# Patient Record
Sex: Male | Born: 1980 | Race: White | Hispanic: No | Marital: Single | Smoking: Current some day smoker
Health system: Southern US, Community
[De-identification: ages and names within clinical notes are randomized; demographics above are authoritative.]

---

## 2007-08-22 ENCOUNTER — Emergency Department (HOSPITAL_COMMUNITY): Admission: EM | Admit: 2007-08-22 | Discharge: 2007-08-22 | Payer: Self-pay | Admitting: Emergency Medicine

## 2009-03-22 IMAGING — CR DG KNEE COMPLETE 4+V*L*
4 series · 4 of 4 positions shown · non-contrast
Comparison: None.

CLINICAL DATA: Fell ? pain in anterolateral knee.  
 LEFT KNEE ? 4 VIEW:

[t knee ap left (1 of 2)]
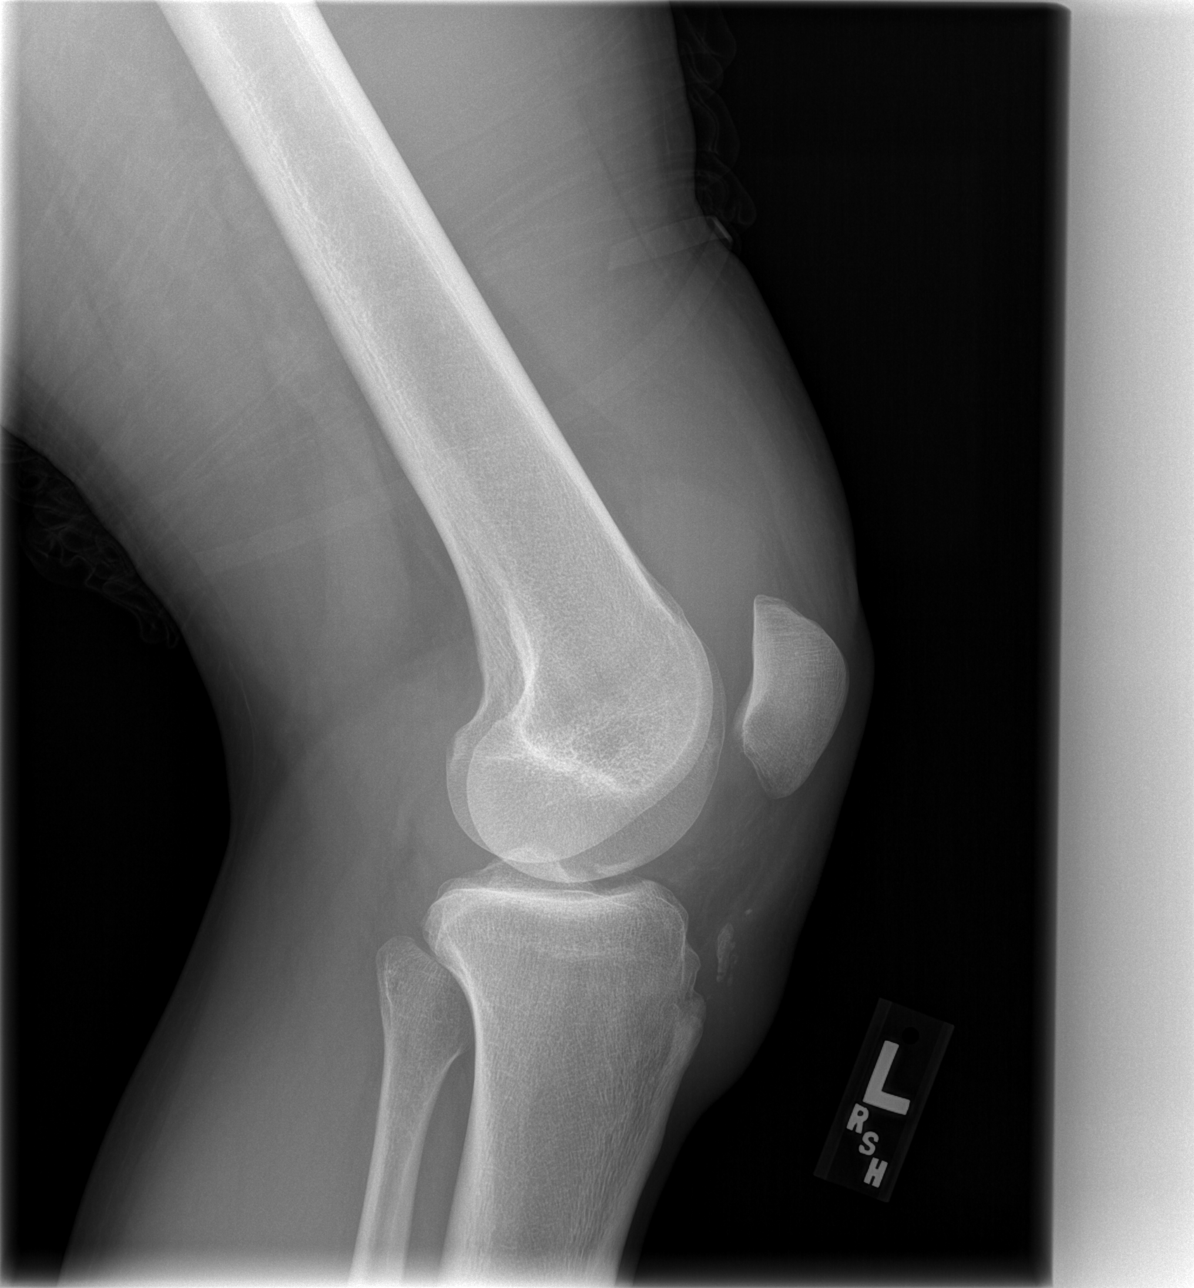

[t knee ap left (2 of 2)]
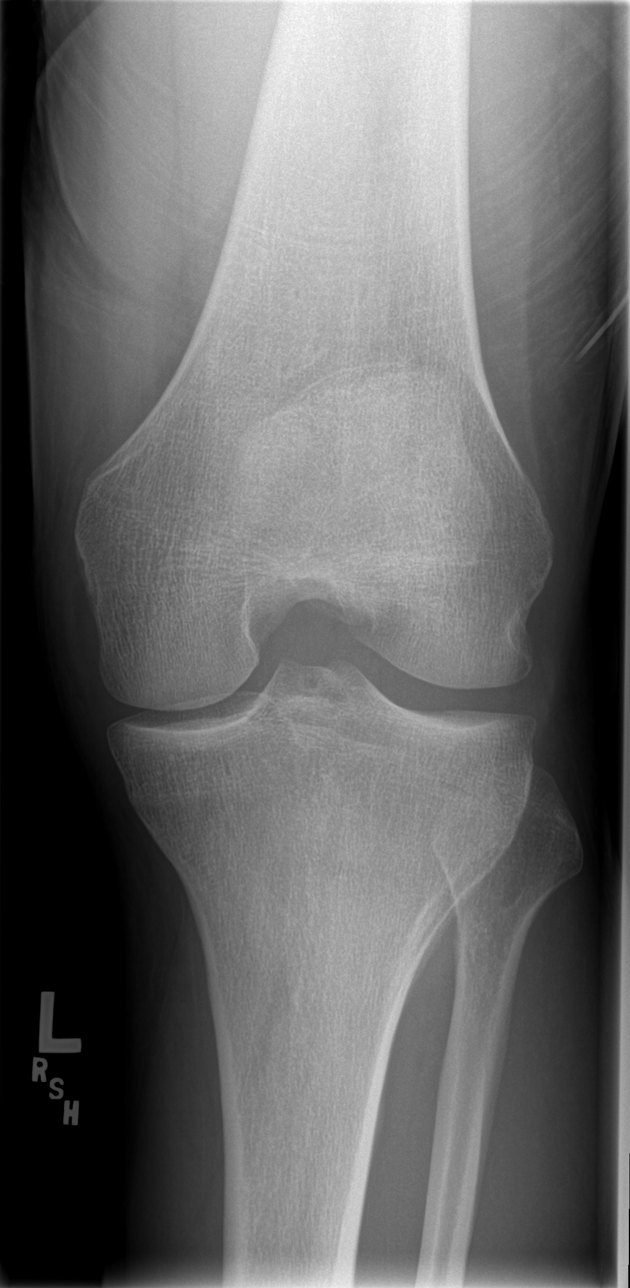

[t knee oblique left (1 of 2)]
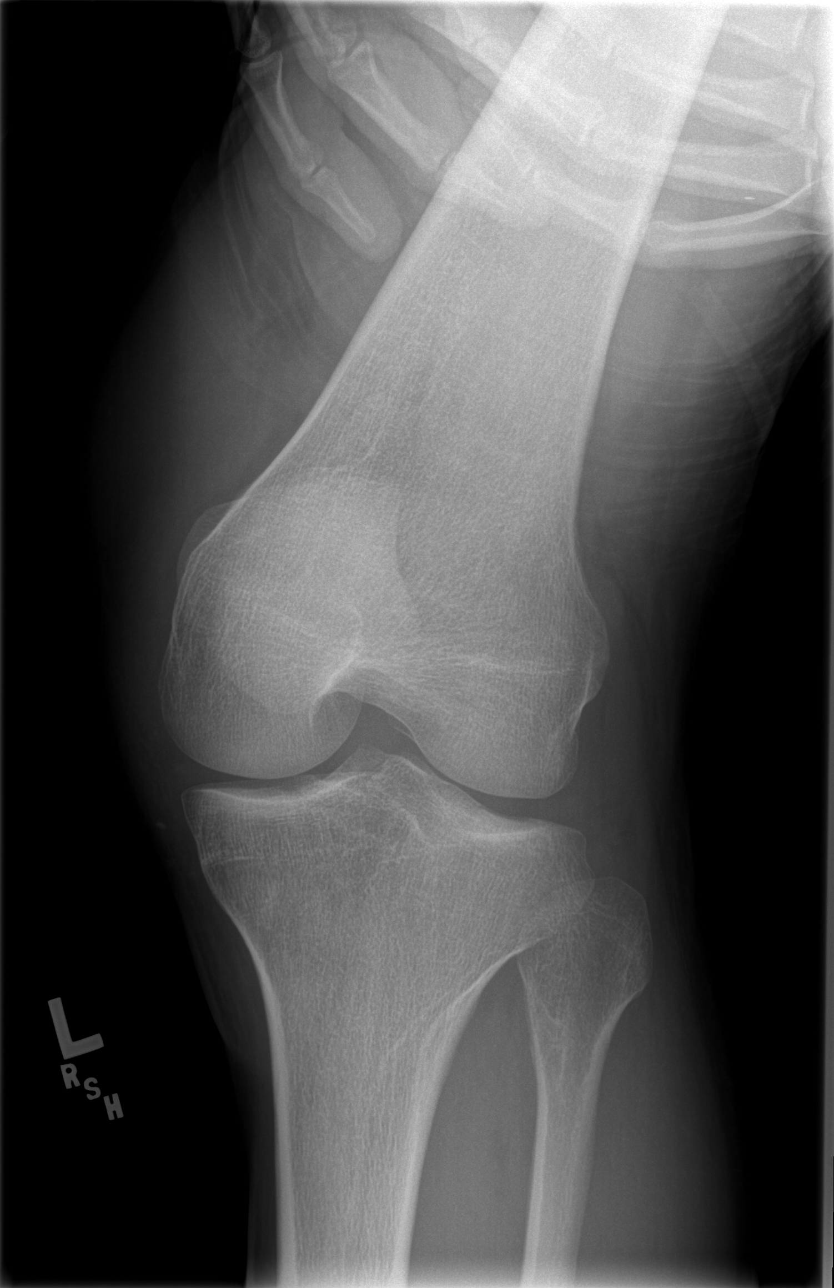

[t knee oblique left (2 of 2)]
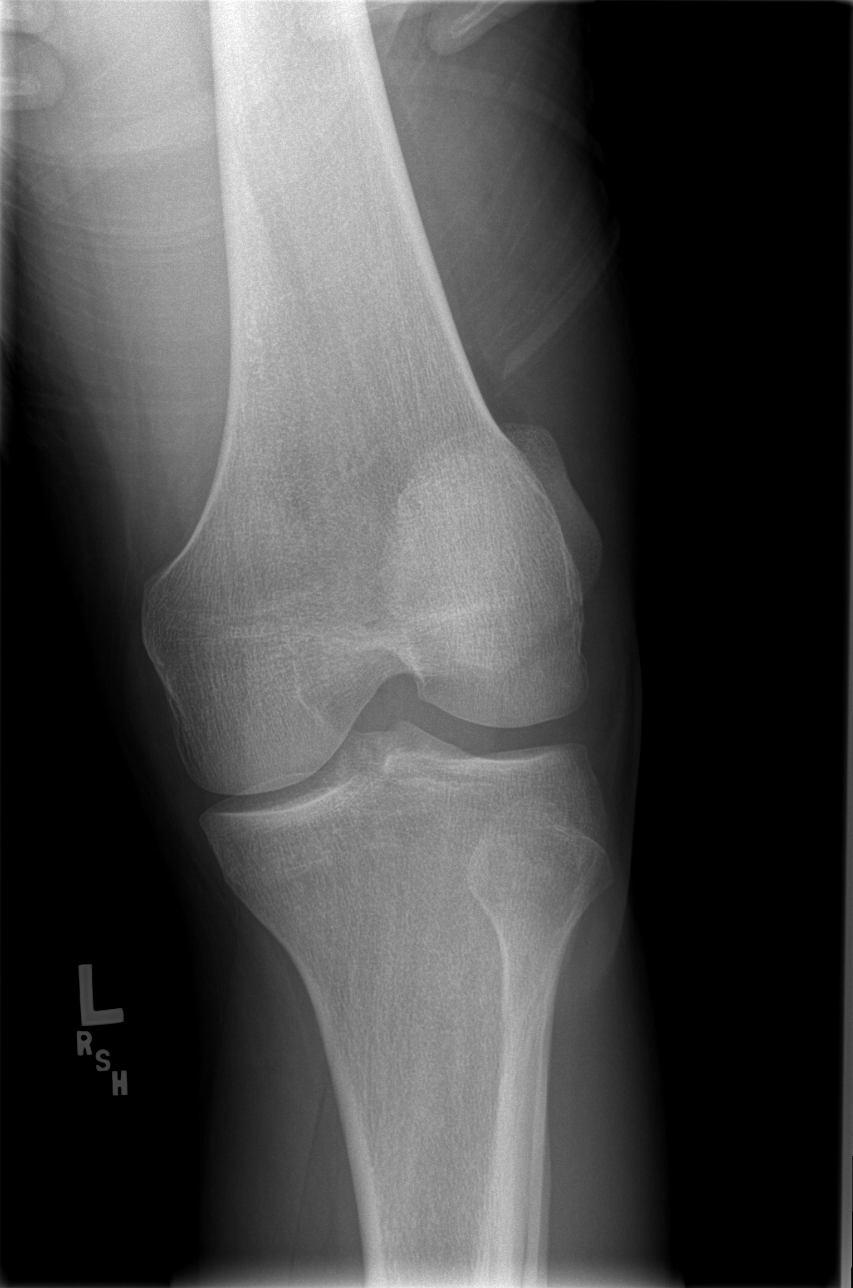

[4 of 4 positions shown; findings below may reference images not displayed]

FINDINGS: There is a joint effusion noted on the lateral view.  There is no definite fracture or dislocation.  There is some bony density anterior to the proximal tibia which may be calcification in the patellar/tibial tendon, or it may be related to fragmentation of the tibial tuberosity.  It is not acute.
IMPRESSION: 1.  There is a large joint effusion without underlying fracture or dislocation. 
 2.  See note above regarding calcification anterior to the proximal tibia.

## 2017-09-06 ENCOUNTER — Other Ambulatory Visit: Payer: Self-pay

## 2017-09-06 ENCOUNTER — Emergency Department (HOSPITAL_COMMUNITY)
Admission: EM | Admit: 2017-09-06 | Discharge: 2017-09-08 | Disposition: A | Discharge status: Other Acute Inpatient Hospital | Payer: Federal, State, Local not specified - Other | Attending: Emergency Medicine | Admitting: Emergency Medicine

## 2017-09-06 DIAGNOSIS — Z046 Encounter for general psychiatric examination, requested by authority: Secondary | ICD-10-CM | POA: Insufficient documentation

## 2017-09-06 DIAGNOSIS — R05 Cough: Secondary | ICD-10-CM | POA: Insufficient documentation

## 2017-09-06 DIAGNOSIS — R45850 Homicidal ideations: Secondary | ICD-10-CM | POA: Insufficient documentation

## 2017-09-06 DIAGNOSIS — R4689 Other symptoms and signs involving appearance and behavior: Secondary | ICD-10-CM | POA: Insufficient documentation

## 2017-09-06 DIAGNOSIS — R456 Violent behavior: Secondary | ICD-10-CM | POA: Insufficient documentation

## 2017-09-06 DIAGNOSIS — R45851 Suicidal ideations: Secondary | ICD-10-CM | POA: Insufficient documentation

## 2017-09-06 DIAGNOSIS — F1220 Cannabis dependence, uncomplicated: Secondary | ICD-10-CM

## 2017-09-06 DIAGNOSIS — R443 Hallucinations, unspecified: Secondary | ICD-10-CM | POA: Insufficient documentation

## 2017-09-06 DIAGNOSIS — F200 Paranoid schizophrenia: Secondary | ICD-10-CM | POA: Insufficient documentation

## 2017-09-06 DIAGNOSIS — F17200 Nicotine dependence, unspecified, uncomplicated: Secondary | ICD-10-CM | POA: Insufficient documentation

## 2017-09-06 DIAGNOSIS — F22 Delusional disorders: Secondary | ICD-10-CM

## 2017-09-06 LAB — SALICYLATE LEVEL

## 2017-09-06 LAB — COMPREHENSIVE METABOLIC PANEL
ALT: 14 U/L — AB (ref 17–63)
AST: 24 U/L (ref 15–41)
Albumin: 4.7 g/dL (ref 3.5–5.0)
Alkaline Phosphatase: 51 U/L (ref 38–126)
Anion gap: 9 (ref 5–15)
BILIRUBIN TOTAL: 0.6 mg/dL (ref 0.3–1.2)
BUN: 12 mg/dL (ref 6–20)
CALCIUM: 9.6 mg/dL (ref 8.9–10.3)
CHLORIDE: 105 mmol/L (ref 101–111)
CO2: 26 mmol/L (ref 22–32)
CREATININE: 1.21 mg/dL (ref 0.61–1.24)
Glucose, Bld: 122 mg/dL — ABNORMAL HIGH (ref 65–99)
Potassium: 4.6 mmol/L (ref 3.5–5.1)
Sodium: 140 mmol/L (ref 135–145)
TOTAL PROTEIN: 7.8 g/dL (ref 6.5–8.1)

## 2017-09-06 LAB — RAPID URINE DRUG SCREEN, HOSP PERFORMED
AMPHETAMINES: NOT DETECTED
Barbiturates: NOT DETECTED
Benzodiazepines: NOT DETECTED
Cocaine: NOT DETECTED
OPIATES: NOT DETECTED
TETRAHYDROCANNABINOL: POSITIVE — AB

## 2017-09-06 LAB — CBC WITH DIFFERENTIAL/PLATELET
BASOS PCT: 0 %
Basophils Absolute: 0 10*3/uL (ref 0.0–0.1)
EOS ABS: 0 10*3/uL (ref 0.0–0.7)
EOS PCT: 0 %
HCT: 42.1 % (ref 39.0–52.0)
HEMOGLOBIN: 14.1 g/dL (ref 13.0–17.0)
LYMPHS PCT: 13 %
Lymphs Abs: 1.4 10*3/uL (ref 0.7–4.0)
MCH: 30.4 pg (ref 26.0–34.0)
MCHC: 33.5 g/dL (ref 30.0–36.0)
MCV: 90.7 fL (ref 78.0–100.0)
MONO ABS: 0.5 10*3/uL (ref 0.1–1.0)
MONOS PCT: 5 %
NEUTROS ABS: 8.3 10*3/uL — AB (ref 1.7–7.7)
Neutrophils Relative %: 82 %
PLATELETS: 228 10*3/uL (ref 150–400)
RBC: 4.64 MIL/uL (ref 4.22–5.81)
RDW: 13.6 % (ref 11.5–15.5)
WBC: 10.3 10*3/uL (ref 4.0–10.5)

## 2017-09-06 LAB — ETHANOL

## 2017-09-06 LAB — ACETAMINOPHEN LEVEL

## 2017-09-06 MED ORDER — ZIPRASIDONE MESYLATE 20 MG IM SOLR
20.0000 mg | Freq: Once | INTRAMUSCULAR | Status: AC
  Administered 2017-09-06: 20 mg via INTRAMUSCULAR
  Filled 2017-09-06: qty 20

## 2017-09-06 MED ORDER — STERILE WATER FOR INJECTION IJ SOLN
INTRAMUSCULAR | Status: AC
  Administered 2017-09-06: 10 mL
  Filled 2017-09-06: qty 10

## 2017-09-06 MED ORDER — DIPHENHYDRAMINE HCL 50 MG/ML IJ SOLN
50.0000 mg | Freq: Once | INTRAMUSCULAR | Status: AC
  Administered 2017-09-06: 50 mg via INTRAMUSCULAR
  Filled 2017-09-06: qty 1

## 2017-09-06 MED ORDER — LORAZEPAM 2 MG/ML IJ SOLN
2.0000 mg | Freq: Once | INTRAMUSCULAR | Status: AC
  Administered 2017-09-06: 2 mg via INTRAMUSCULAR
  Filled 2017-09-06: qty 1
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
## 2017-09-06 NOTE — ED Notes (Signed)
Bed: [facility] Expected date:  Expected time:  Means of arrival:  Comments: 

## 2017-09-06 NOTE — ED Notes (Signed)
Patient drank urine sample. MD made aware.

## 2017-09-06 NOTE — ED Notes (Signed)
ED Provider at bedside. 
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
## 2017-09-06 NOTE — ED Notes (Signed)
Patient extremely paranoid and tearful on arrival to the unit. Standing in front of his door room refusing to go in. Patient stated that "people will atatck me when I lay down and I might be dead before I woke up. I want to stand here and fight for my self". Patient continues to say that he is scared of his life and for everybody in this world and doesn't know what's going to happen if he closed his eyes in a minute. Patient cries on/off. Refuses to go in his room on several attempt. Spencer NP, notified who gave one time order of Benadryl 50 mg, Ativan 2 mg and Geodon 20 mg IM. Patient very tearful when giving him the shot and he kept pleading that we save him by not giving him the shot. Initially was not cooperative with this Clinical research associate but complied when the security/[employer] got involved. Sitting in his bed at this time. Will continue to monitor patient.

## 2017-09-07 DIAGNOSIS — F1220 Cannabis dependence, uncomplicated: Secondary | ICD-10-CM

## 2017-09-07 DIAGNOSIS — F200 Paranoid schizophrenia: Secondary | ICD-10-CM | POA: Diagnosis present

## 2017-09-07 MED ORDER — GABAPENTIN 300 MG PO CAPS
300.0000 mg | ORAL_CAPSULE | Freq: Two times a day (BID) | ORAL | Status: DC
  Administered 2017-09-08: 300 mg via ORAL
  Filled 2017-09-07 (×2): qty 1

## 2017-09-07 MED ORDER — HYDROXYZINE HCL 25 MG PO TABS
25.0000 mg | ORAL_TABLET | Freq: Two times a day (BID) | ORAL | Status: DC
  Filled 2017-09-07: qty 1

## 2017-09-07 MED ORDER — HALOPERIDOL 5 MG PO TABS
5.0000 mg | ORAL_TABLET | Freq: Two times a day (BID) | ORAL | Status: DC
  Administered 2017-09-08: 5 mg via ORAL
  Filled 2017-09-07 (×2): qty 1

## 2017-09-07 MED ORDER — LORAZEPAM 2 MG/ML IJ SOLN: 2.0000 mg | Freq: Once | INTRAMUSCULAR | Status: DC | PRN

## 2017-09-07 MED ORDER — ZIPRASIDONE MESYLATE 20 MG IM SOLR: 20.0000 mg | Freq: Once | INTRAMUSCULAR | Status: DC | PRN

## 2017-09-07 NOTE — ED Notes (Signed)
EDP at bedside to assess pt 

## 2017-09-07 NOTE — BH Assessment (Signed)
[facility] Assessment Progress Note  Per Thedore Mins, MD, this pt requires psychiatric hospitalization at this time.  Pt presents under IVC initiated by law enforcement and upheld by Dr Jannifer Franklin.  The following facilities have been contacted to seek placement for this pt, with results as noted:  Beds available, information sent, decision pending:  [facility]   At capacity:  [facility] Doran Heater, [place] Triage Specialist [phone]

## 2017-09-07 NOTE — ED Notes (Signed)
Pt informed this nurse again that he drank his urine and needed to wash it down with pepper and salt. Pt received cup from dinner tray. Cup removed from room.  Pt counseled in regards to this behavior. This nurse notified Julieanne Cotton- Special checks q 15 mins in place for safety. Video surveillance in place. Will continue to monitor.
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
## 2017-09-07 NOTE — ED Notes (Signed)
Pt refusing PO medication regimen. Pt paranoid, bizarre behavior. Pt reports that medication is not sterile and must be boiled before taking. This nurse notified Dr.A. Pt also observed holding his groin, reports to this nurse that he has had a  Hernia for years. This nurse notified EDP. Reports he will be on unit to assess pt. Special checks q 15 mins in place for safety, Video monitoring in place. Will continue to monitor.

## 2017-09-07 NOTE — ED Notes (Signed)
This writer observed pt placing full carton of milk down pt's pants. This Clinical research associate asked pt if he was alright and if he needed help, pt stated "I need to get out of this place."

## 2017-09-07 NOTE — ED Notes (Signed)
Pt informed this nurse that he drank his urine from a cup because he can not drink the [place] water. This nurse notified Jacki Cones NP. No new orders given.

## 2017-09-07 NOTE — Progress Notes (Signed)
09/07/17 1352:  LRT went to pt room to offer activities, pt declined stating "people might get mad".  Pt also stated that he was the [employer] logo that pops up at the end of some movies.   Caroll Rancher, LRT/CTRS
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
## 2017-09-08 ENCOUNTER — Inpatient Hospital Stay (HOSPITAL_COMMUNITY)
Admission: AD | Admit: 2017-09-08 | Discharge: 2017-09-21 | DRG: 885 | Disposition: A | Discharge status: Home / Self Care | Payer: Federal, State, Local not specified - Other | Attending: Psychiatry | Admitting: Psychiatry

## 2017-09-08 DIAGNOSIS — X810XXA Intentional self-harm by jumping or lying in front of motor vehicle, initial encounter: Secondary | ICD-10-CM | POA: Diagnosis not present

## 2017-09-08 DIAGNOSIS — F1910 Other psychoactive substance abuse, uncomplicated: Secondary | ICD-10-CM | POA: Diagnosis not present

## 2017-09-08 DIAGNOSIS — K219 Gastro-esophageal reflux disease without esophagitis: Secondary | ICD-10-CM | POA: Diagnosis present

## 2017-09-08 DIAGNOSIS — F29 Unspecified psychosis not due to a substance or known physiological condition: Secondary | ICD-10-CM

## 2017-09-08 DIAGNOSIS — R443 Hallucinations, unspecified: Secondary | ICD-10-CM

## 2017-09-08 DIAGNOSIS — R4586 Emotional lability: Secondary | ICD-10-CM

## 2017-09-08 DIAGNOSIS — G4700 Insomnia, unspecified: Secondary | ICD-10-CM | POA: Diagnosis present

## 2017-09-08 DIAGNOSIS — F200 Paranoid schizophrenia: Secondary | ICD-10-CM | POA: Diagnosis present

## 2017-09-08 DIAGNOSIS — F17210 Nicotine dependence, cigarettes, uncomplicated: Secondary | ICD-10-CM | POA: Diagnosis not present

## 2017-09-08 DIAGNOSIS — F1290 Cannabis use, unspecified, uncomplicated: Secondary | ICD-10-CM | POA: Diagnosis present

## 2017-09-08 DIAGNOSIS — R45850 Homicidal ideations: Secondary | ICD-10-CM

## 2017-09-08 DIAGNOSIS — F259 Schizoaffective disorder, unspecified: Secondary | ICD-10-CM | POA: Diagnosis present

## 2017-09-08 DIAGNOSIS — F22 Delusional disorders: Secondary | ICD-10-CM

## 2017-09-08 DIAGNOSIS — K5900 Constipation, unspecified: Secondary | ICD-10-CM | POA: Diagnosis present

## 2017-09-08 DIAGNOSIS — F1220 Cannabis dependence, uncomplicated: Secondary | ICD-10-CM | POA: Diagnosis not present

## 2017-09-08 DIAGNOSIS — R454 Irritability and anger: Secondary | ICD-10-CM | POA: Diagnosis not present

## 2017-09-08 MED ORDER — BENZTROPINE MESYLATE 1 MG PO TABS
1.0000 mg | ORAL_TABLET | Freq: Two times a day (BID) | ORAL | Status: DC
  Administered 2017-09-08 (×2): 1 mg via ORAL
  Filled 2017-09-08 (×2): qty 1

## 2017-09-08 MED ORDER — LORAZEPAM 2 MG/ML IJ SOLN
2.0000 mg | Freq: Once | INTRAMUSCULAR | Status: AC
  Administered 2017-09-08: 2 mg via INTRAMUSCULAR
  Filled 2017-09-08: qty 1

## 2017-09-08 MED ORDER — HALOPERIDOL LACTATE 5 MG/ML IJ SOLN
5.0000 mg | Freq: Two times a day (BID) | INTRAMUSCULAR | Status: DC
Start: 2017-09-08 — End: 2017-09-08

## 2017-09-08 MED ORDER — BENZTROPINE MESYLATE 1 MG/ML IJ SOLN: 1.0000 mg | Freq: Two times a day (BID) | INTRAMUSCULAR | Status: DC

## 2017-09-08 MED ORDER — HALOPERIDOL LACTATE 5 MG/ML IJ SOLN
INTRAMUSCULAR | Status: AC
  Filled 2017-09-08: qty 1

## 2017-09-08 MED ORDER — DIPHENHYDRAMINE HCL 50 MG/ML IJ SOLN
50.0000 mg | Freq: Once | INTRAMUSCULAR | Status: AC
  Administered 2017-09-08: 50 mg via INTRAMUSCULAR
  Filled 2017-09-08: qty 1

## 2017-09-08 MED ORDER — HYDROXYZINE HCL 25 MG PO TABS
25.0000 mg | ORAL_TABLET | Freq: Two times a day (BID) | ORAL | Status: DC
  Administered 2017-09-08: 25 mg via ORAL
  Filled 2017-09-08: qty 1

## 2017-09-08 MED ORDER — HALOPERIDOL LACTATE 5 MG/ML IJ SOLN
10.0000 mg | Freq: Once | INTRAMUSCULAR | Status: AC
  Administered 2017-09-08: 10 mg via INTRAMUSCULAR
  Filled 2017-09-08: qty 2

## 2017-09-08 NOTE — Consult Note (Signed)
[facility] Consult   Reason for Consult:  Paranoid, psychotic and bizarre Referring Physician:  EDP Patient Identification: Stephen Harris MRN:  [id number] Principal Diagnosis: Paranoid schizophrenia [facility]) Diagnosis:   Patient Active Problem List   Diagnosis Date Noted  . Paranoid schizophrenia ([place]) [F20.0] 09/07/2017    Priority: High  . Cannabis use disorder, moderate, dependence ([place]) [F12.20] 09/07/2017    Total Time spent with patient: 45 minutes  Subjective:   Stephen Harris is a 36 y.o. male patient admitted with delusions and psychosis .  HPI:  Patient who was brought to [facility] by the police after he jumped out of a moving vehicle and said he was going to kill one of the Architect workers with the [place] who was replacing road side meters. He is extremely paranoid and psychotic. He has a fixed delusions that "the power company is out to get him" and says he should have "blow myself  up before getting arrested". Patient has refused to take his medications and inspect his food carefully due to fear of being poisoned. He is not sleeping, has racing thoughts, talk to himself constantly and hypervigilant. Patient has refused grooming and personal hygiene. He reports history of Cannabis use disorder.  Past Psychiatric History: as above  Risk to Self: Suicidal Ideation: No-Not Currently/Within Last 6 Months Suicidal Intent: No Is patient at risk for suicide?: No Suicidal Plan?: No Access to Means: No What has been your use of drugs/alcohol within the last 12 months?: THC How many times?:  (Unknown) Other Self Harm Risks: Unknown Triggers for Past Attempts: Unknown Intentional Self Injurious Behavior: None Risk to Others: Homicidal Ideation: Yes-Currently Present Thoughts of Harm to Others: Yes-Currently Present Comment - Thoughts of Harm to Others: Road workers Current Homicidal Intent: No Current Homicidal Plan: No Access to Homicidal Means:  No Identified Victim: Utility workers History of harm to others?: No (Unknown) Assessment of Violence: On admission Violent Behavior Description: Threats to harm others Does patient have access to weapons?: Yes (Comment) (Pt had a knife earlier today, threatening others with it.) Criminal Charges Pending?: No (Had a knife.) Does patient have a court date: No Prior Inpatient Therapy: Prior Inpatient Therapy: Yes Prior Therapy Dates: Unknown Prior Therapy Facilty/Provider(s): Unknown Reason for Treatment: Unknown Prior Outpatient Therapy: Prior Outpatient Therapy: No Prior Therapy Dates: Unknown Prior Therapy Facilty/Provider(s): Unknown Reason for Treatment: Unknown Does patient have an ACCT team?: No Does patient have Intensive In-House Services?  : No Does patient have [facility] services? : No Does patient have P4CC services?: No  Past Medical History: No past medical history on file. No past surgical history on file. Family History: No family history on file. Family Psychiatric  History:  Social History:  History  Alcohol use Not on file     History  Drug use: Unknown    Social History   Social History  . Marital status: Single    Spouse name: N/A  . Number of children: N/A  . Years of education: N/A   Social History Main Topics  . Smoking status: Not on file  . Smokeless tobacco: Not on file  . Alcohol use Not on file  . Drug use: Unknown  . Sexual activity: Not on file   Other Topics Concern  . Not on file   Social History Narrative  . No narrative on file   Additional Social History:    Allergies:   Allergies  Allergen Reactions  . Penicillins Hives    Has patient  had a PCN reaction causing immediate rash, facial/tongue/throat swelling, SOB or lightheadedness with hypotension: Yes Has patient had a PCN reaction causing severe rash involving mucus membranes or skin necrosis: No Has patient had a PCN reaction that required hospitalization: Unknown Has  patient had a PCN reaction occurring within the last 10 years: No If all of the above answers are "NO", then may proceed with Cephalosporin use.     Labs:  Results for orders placed or performed during the hospital encounter of 09/06/17 (from the past 48 hour(s))  Urine rapid drug screen (hosp performed)     Status: Abnormal   Collection Time: 09/06/17  6:07 PM  Result Value Ref Range   Opiates NONE DETECTED NONE DETECTED   Cocaine NONE DETECTED NONE DETECTED   Benzodiazepines NONE DETECTED NONE DETECTED   Amphetamines NONE DETECTED NONE DETECTED   Tetrahydrocannabinol POSITIVE (A) NONE DETECTED   Barbiturates NONE DETECTED NONE DETECTED    Comment:        DRUG SCREEN FOR MEDICAL PURPOSES ONLY.  IF CONFIRMATION IS NEEDED FOR ANY PURPOSE, NOTIFY LAB WITHIN 5 DAYS.        LOWEST DETECTABLE LIMITS FOR URINE DRUG SCREEN Drug Class       Cutoff (ng/mL) Amphetamine      1000 Barbiturate      200 Benzodiazepine   914 Tricyclics       782 Opiates          300 Cocaine          300 THC              50   Comprehensive metabolic panel     Status: Abnormal   Collection Time: 09/06/17  6:21 PM  Result Value Ref Range   Sodium 140 135 - 145 mmol/L   Potassium 4.6 3.5 - 5.1 mmol/L   Chloride 105 101 - 111 mmol/L   CO2 26 22 - 32 mmol/L   Glucose, Bld 122 (H) 65 - 99 mg/dL   BUN 12 6 - 20 mg/dL   Creatinine, Ser 1.21 0.61 - 1.24 mg/dL   Calcium 9.6 8.9 - 10.3 mg/dL   Total Protein 7.8 6.5 - 8.1 g/dL   Albumin 4.7 3.5 - 5.0 g/dL   AST 24 15 - 41 U/L   ALT 14 (L) 17 - 63 U/L   Alkaline Phosphatase 51 38 - 126 U/L   Total Bilirubin 0.6 0.3 - 1.2 mg/dL   GFR calc non Af Amer >60 >60 mL/min   GFR calc Af Amer >60 >60 mL/min    Comment: (NOTE) The eGFR has been calculated using the CKD EPI equation. This calculation has not been validated in all clinical situations. eGFR's persistently <60 mL/min signify possible Chronic Kidney Disease.    Anion gap 9 5 - 15  Ethanol     Status:  None   Collection Time: 09/06/17  6:21 PM  Result Value Ref Range   Alcohol, Ethyl (B) <10 <10 mg/dL    Comment:        LOWEST DETECTABLE LIMIT FOR SERUM ALCOHOL IS 10 mg/dL FOR MEDICAL PURPOSES ONLY Please note change in reference range.   CBC with Diff     Status: Abnormal   Collection Time: 09/06/17  6:21 PM  Result Value Ref Range   WBC 10.3 4.0 - 10.5 K/uL   RBC 4.64 4.22 - 5.81 MIL/uL   Hemoglobin 14.1 13.0 - 17.0 g/dL   HCT 42.1 39.0 - 52.0 %  MCV 90.7 78.0 - 100.0 fL   MCH 30.4 26.0 - 34.0 pg   MCHC 33.5 30.0 - 36.0 g/dL   RDW 13.6 11.5 - 15.5 %   Platelets 228 150 - 400 K/uL   Neutrophils Relative % 82 %   Neutro Abs 8.3 (H) 1.7 - 7.7 K/uL   Lymphocytes Relative 13 %   Lymphs Abs 1.4 0.7 - 4.0 K/uL   Monocytes Relative 5 %   Monocytes Absolute 0.5 0.1 - 1.0 K/uL   Eosinophils Relative 0 %   Eosinophils Absolute 0.0 0.0 - 0.7 K/uL   Basophils Relative 0 %   Basophils Absolute 0.0 0.0 - 0.1 K/uL  Salicylate level     Status: None   Collection Time: 09/06/17  6:21 PM  Result Value Ref Range   Salicylate Lvl <6.6 2.8 - 30.0 mg/dL  Acetaminophen level     Status: Abnormal   Collection Time: 09/06/17  6:21 PM  Result Value Ref Range   Acetaminophen (Tylenol), Serum <10 (L) 10 - 30 ug/mL    Comment:        THERAPEUTIC CONCENTRATIONS VARY SIGNIFICANTLY. A RANGE OF 10-30 ug/mL MAY BE AN EFFECTIVE CONCENTRATION FOR MANY PATIENTS. HOWEVER, SOME ARE BEST TREATED AT CONCENTRATIONS OUTSIDE THIS RANGE. ACETAMINOPHEN CONCENTRATIONS >150 ug/mL AT 4 HOURS AFTER INGESTION AND >50 ug/mL AT 12 HOURS AFTER INGESTION ARE OFTEN ASSOCIATED WITH TOXIC REACTIONS.     Current Facility-Administered Medications  Medication Dose Route Frequency Provider Last Rate Last Dose  . haloperidol lactate (HALDOL) 5 MG/ML injection           . benztropine (COGENTIN) tablet 1 mg  1 mg Oral BID Lizvet Chunn, MD       Or  . benztropine mesylate (COGENTIN) injection 1 mg  1 mg  Intramuscular BID Brooklen Runquist, MD      . diphenhydrAMINE (BENADRYL) injection 50 mg  50 mg Intramuscular Once Calla Wedekind, MD      . gabapentin (NEURONTIN) capsule 300 mg  300 mg Oral BID Frankey Botting, MD      . haloperidol (HALDOL) tablet 5 mg  5 mg Oral BID Dondrea Clendenin, MD      . haloperidol lactate (HALDOL) injection 10 mg  10 mg Intramuscular Once Charlissa Petros, MD      . haloperidol lactate (HALDOL) injection 5 mg  5 mg Intramuscular BID Conny Moening, MD      . hydrOXYzine (ATARAX/VISTARIL) tablet 25 mg  25 mg Oral BID Isha Seefeld, MD      . LORazepam (ATIVAN) injection 2 mg  2 mg Intramuscular Once Corena Pilgrim, MD       Current Outpatient Prescriptions  Medication Sig Dispense Refill  . aspirin 325 MG tablet Take 325 mg by mouth every 4 (four) hours as needed for mild pain.      Musculoskeletal: Strength & Muscle Tone: within normal limits Gait & Station: normal Patient leans: N/A  Psychiatric Specialty Exam: Physical Exam  Psychiatric: His affect is labile. His speech is rapid and/or pressured and tangential. He is aggressive, actively hallucinating and combative. Thought content is paranoid and delusional. Cognition and memory are normal. He expresses impulsivity. He expresses homicidal ideation. He expresses homicidal plans.    Review of Systems  Constitutional: Negative.   HENT: Negative.   Eyes: Negative.   Respiratory: Negative.   Cardiovascular: Negative.   Gastrointestinal: Negative.   Genitourinary: Negative.   Musculoskeletal: Negative.   Skin: Negative.   Neurological: Negative.   Endo/Heme/Allergies: Negative.  Psychiatric/Behavioral: Positive for hallucinations and substance abuse. The patient is nervous/anxious and has insomnia.     Blood pressure 126/73, pulse 69, temperature 99 F (37.2 C), temperature source Oral, resp. rate 16, height 6' 2"  (1.88 m), weight 82.1 kg (181 lb), SpO2 99 %.Body mass index is 23.24 kg/m.   General Appearance: Disheveled  Eye Contact:  Good  Speech:  Pressured  Volume:  Increased  Mood:  Irritable  Affect:  Labile  Thought Process:  Disorganized  Orientation:  Full (Time, Place, and Person)  Thought Content:  Illogical, Hallucinations: Auditory and Paranoid Ideation  Suicidal Thoughts:  No  Homicidal Thoughts:  Yes.  without intent/plan  Memory:  Immediate;   Fair Recent;   Fair Remote;   Fair  Judgement:  Poor  Insight:  Lacking  Psychomotor Activity:  Increased and Restlessness  Concentration:  Concentration: Fair and Attention Span: Fair  Recall:  [employer] of Knowledge:  Fair  Language:  Good  Akathisia:  No  Handed:  Right  AIMS (if indicated):     Assets:  Communication Skills  ADL's:  Intact  Cognition:  WNL  Sleep:   poor     Treatment Plan Summary: Daily contact with patient to assess and evaluate symptoms and progress in treatment and Medication management Patient was evaluated by Jola Schmidt, MD for second opinion to administer forced medication due to patient's level of acuity. Encourage patient to take oral medication. Haldol 5 mg PO or IM bid for psychosis/delusions Cogentin 1 mg bid PO or IM bid for EPS prevention Gabapentin 300 mg bid for aggression  Disposition: Recommend psychiatric Inpatient admission when medically cleared.  Corena Pilgrim, MD 09/08/2017 10:56 AM

## 2017-09-08 NOTE — ED Notes (Signed)
Up to the bathroom 

## 2017-09-08 NOTE — Progress Notes (Signed)
09/08/17 1412:  LRT introduced self to pt and offered activities.  Pt was standing in his doorway, talking about his tomato plants.  Pt was unable to focus on questions from LRT.  Pt was just rambling about various topics.   Caroll Rancher, LRT/CTRS

## 2017-09-08 NOTE — ED Notes (Signed)
Patient took a shower and cleaned up after himself. Patient was thankful to staff for helping him. Encouragement and support provided and safety maintain. Q 15 min safety checks remain in place and video monitoring.

## 2017-09-08 NOTE — ED Notes (Addendum)
Patient denies SI/HI/AVH. Plan care discussed. Encouragement and support provided and safety maintain. Q 15 min safety checks remain in place and video monitoring.

## 2017-09-08 NOTE — ED Notes (Addendum)
EDP into see.  Pt up in door, requesting a cup of "boiled sterilized water turned into ice.Marland KitchenMarland Kitchen"

## 2017-09-08 NOTE — ED Provider Notes (Signed)
The patient in unable to make decisions for himself. I think he will benefit from psychiatric medications for stabilization.  He does not have the ability to refuse these mentally and if necessary I think forced meds will be appropriate   Azalia Bilis, MD 09/08/17 [phone]

## 2017-09-08 NOTE — ED Notes (Signed)
Pt escorted to the bathroom to void.

## 2017-09-08 NOTE — ED Notes (Signed)
Laying on the bed, calmer

## 2017-09-08 NOTE — ED Notes (Signed)
VS defered, pt sleeping

## 2017-09-08 NOTE — ED Notes (Signed)
[facility] contacted for patient transport to [facility]. 

## 2017-09-08 NOTE — ED Notes (Signed)
Patient took medication without hesitation.

## 2017-09-08 NOTE — ED Notes (Signed)
mHt reports that pt told her that he has top drink his urine because it is ADS infected and can not touch water or anything else.  Pt has been refusing to use the bathroom

## 2017-09-08 NOTE — ED Notes (Signed)
Up in room appears to be praying over his breakfast

## 2017-09-08 NOTE — ED Notes (Signed)
Attempted to call report nurse unable to take report at this time.

## 2017-09-08 NOTE — ED Notes (Addendum)
Pt medicated w/o difficulty.  Remained up in room, concerned about his getting his paperwork, and the talking about the dinosaurs support given.

## 2017-09-08 NOTE — ED Notes (Signed)
Per mHt pt declined to allow temp to be taken

## 2017-09-08 NOTE — ED Notes (Signed)
Patient transported to [facility], by [facility], for continuation of specialized care. Belongings given to deputy. Pt left in no acute distress.

## 2017-09-08 NOTE — ED Notes (Signed)
standing in door yelling about "all over my body" support given

## 2017-09-08 NOTE — BH Assessment (Signed)
[facility] Assessment Progress Note  er Thedore Mins, MD, this pt requires psychiatric hospitalization.  Malva Limes, RN, [facility] has assigned pt to [facility] Rm [id number]; they will call when they are ready to receive pt.  Pt presents under IVC initiated by law enforcement, and upheld by Thedore Mins, MD, and IVC documents have been faxed to [facility].  Pt's nurse, Wille Celeste, has been notified, and agrees to call report to [phone].  Pt is to be transported via Patent examiner.   Doylene Canning, MA Triage Specialist [phone]

## 2017-09-09 ENCOUNTER — Other Ambulatory Visit: Payer: Self-pay

## 2017-09-09 ENCOUNTER — Encounter (HOSPITAL_COMMUNITY): Payer: Self-pay

## 2017-09-09 DIAGNOSIS — F419 Anxiety disorder, unspecified: Secondary | ICD-10-CM

## 2017-09-09 DIAGNOSIS — F17210 Nicotine dependence, cigarettes, uncomplicated: Secondary | ICD-10-CM

## 2017-09-09 DIAGNOSIS — R4587 Impulsiveness: Secondary | ICD-10-CM

## 2017-09-09 DIAGNOSIS — R450 Nervousness: Secondary | ICD-10-CM

## 2017-09-09 DIAGNOSIS — R4582 Worries: Secondary | ICD-10-CM

## 2017-09-09 DIAGNOSIS — R45850 Homicidal ideations: Secondary | ICD-10-CM

## 2017-09-09 DIAGNOSIS — F1910 Other psychoactive substance abuse, uncomplicated: Secondary | ICD-10-CM

## 2017-09-09 DIAGNOSIS — X810XXA Intentional self-harm by jumping or lying in front of motor vehicle, initial encounter: Secondary | ICD-10-CM

## 2017-09-09 DIAGNOSIS — F200 Paranoid schizophrenia: Secondary | ICD-10-CM | POA: Diagnosis present

## 2017-09-09 DIAGNOSIS — F1220 Cannabis dependence, uncomplicated: Secondary | ICD-10-CM

## 2017-09-09 DIAGNOSIS — R440 Auditory hallucinations: Secondary | ICD-10-CM

## 2017-09-09 DIAGNOSIS — F39 Unspecified mood [affective] disorder: Secondary | ICD-10-CM

## 2017-09-09 DIAGNOSIS — R4586 Emotional lability: Secondary | ICD-10-CM

## 2017-09-09 MED ORDER — DIPHENHYDRAMINE HCL 50 MG/ML IJ SOLN: 50.0000 mg | Freq: Once | INTRAMUSCULAR | Status: DC | PRN

## 2017-09-09 MED ORDER — HALOPERIDOL 5 MG PO TABS
5.0000 mg | ORAL_TABLET | Freq: Two times a day (BID) | ORAL | Status: DC
  Administered 2017-09-09: 2.5 mg via ORAL
  Administered 2017-09-09 – 2017-09-11 (×4): 5 mg via ORAL
  Filled 2017-09-09 (×7): qty 1

## 2017-09-09 MED ORDER — MAGNESIUM HYDROXIDE 400 MG/5ML PO SUSP: 30.0000 mL | Freq: Every day | ORAL | Status: DC | PRN

## 2017-09-09 MED ORDER — HALOPERIDOL LACTATE 5 MG/ML IJ SOLN
5.0000 mg | Freq: Two times a day (BID) | INTRAMUSCULAR | Status: DC
  Filled 2017-09-09 (×7): qty 1

## 2017-09-09 MED ORDER — HALOPERIDOL LACTATE 5 MG/ML IJ SOLN: 10.0000 mg | Freq: Once | INTRAMUSCULAR | Status: DC | PRN

## 2017-09-09 MED ORDER — ACETAMINOPHEN 325 MG PO TABS: 650.0000 mg | ORAL_TABLET | Freq: Four times a day (QID) | ORAL | Status: DC | PRN

## 2017-09-09 MED ORDER — HYDROXYZINE HCL 25 MG PO TABS
25.0000 mg | ORAL_TABLET | Freq: Two times a day (BID) | ORAL | Status: DC
  Administered 2017-09-09 – 2017-09-21 (×23): 25 mg via ORAL
  Filled 2017-09-09 (×27): qty 1

## 2017-09-09 MED ORDER — LORAZEPAM 2 MG/ML IJ SOLN: 2.0000 mg | Freq: Once | INTRAMUSCULAR | Status: DC | PRN

## 2017-09-09 MED ORDER — GABAPENTIN 300 MG PO CAPS
300.0000 mg | ORAL_CAPSULE | Freq: Two times a day (BID) | ORAL | Status: DC
  Administered 2017-09-09 – 2017-09-18 (×19): 300 mg via ORAL
  Filled 2017-09-09 (×25): qty 1

## 2017-09-09 MED ORDER — BENZTROPINE MESYLATE 1 MG PO TABS
1.0000 mg | ORAL_TABLET | Freq: Two times a day (BID) | ORAL | Status: DC
  Administered 2017-09-09 – 2017-09-21 (×25): 1 mg via ORAL
  Filled 2017-09-09 (×27): qty 1

## 2017-09-09 MED ORDER — ALUM & MAG HYDROXIDE-SIMETH 200-200-20 MG/5ML PO SUSP: 30.0000 mL | ORAL | Status: DC | PRN

## 2017-09-09 MED ORDER — BENZTROPINE MESYLATE 1 MG/ML IJ SOLN
1.0000 mg | Freq: Two times a day (BID) | INTRAMUSCULAR | Status: DC
  Filled 2017-09-09 (×7): qty 1

## 2017-09-09 MED ORDER — TRAZODONE HCL 50 MG PO TABS
50.0000 mg | ORAL_TABLET | Freq: Every day | ORAL | Status: DC
  Filled 2017-09-09 (×14): qty 1

## 2017-09-09 MED ORDER — ENSURE ENLIVE PO LIQD
237.0000 mL | Freq: Two times a day (BID) | ORAL | Status: DC
  Administered 2017-09-10 – 2017-09-21 (×10): 237 mL via ORAL

## 2017-09-09 NOTE — [facility] Group Notes (Signed)
LCSW Group Therapy 09/09/2017 1:15pm  Type of Therapy and Topic:  Group Therapy:  Change and Accountability  Participation Level:  Did Not Attend  Description of Group In this group, patients discussed power and accountability for change.  The group identified the challenges related to accountability and the difficulty of accepting the outcomes of negative behaviors.  Patients were encouraged to openly discuss a challenge/change they could take responsibility for.  Patients discussed the use of "change talk" and positive thinking as ways to support achievement of personal goals.  The group discussed ways to give support and empowerment to peers.  Therapeutic Goals: 1. Patients will state the relationship between personal power and accountability in the change process 2. Patients will identify the positive and negative consequences of a personal choice they have made 3. Patients will identify one challenge/choice they will take responsibility for making 4. Patients will discuss the role of "change talk" and the impact of positive thinking as it supports successful personal change 5. Patients will verbalize support and affirmation of change efforts in peers  Summary of Patient Progress:    Therapeutic Modalities Solution Focused Brief Therapy Motivational Interviewing Cognitive Behavioral Therapy  Carlynn Herald Work 09/09/2017 2:46 PM

## 2017-09-09 NOTE — Progress Notes (Signed)
D: Pt denies SI/HI/AVH. Pt is pleasant and cooperative. Pt presents disorganized and paranoid at times, pt forwards little information at this time. Pt was in the dayroom watching TV earlier but kept to himself mostly.   A: Pt was offered support and encouragement. Pt was given scheduled medications. Pt was encourage to attend groups. Q 15 minute checks were done for safety.   R:Pt attends groups and interacts well with peers and staff. Pt is taking medication. Pt has no complaints.Pt receptive to treatment and safety maintained on unit.

## 2017-09-09 NOTE — Progress Notes (Signed)
NUTRITION ASSESSMENT  Pt identified as at risk on the Malnutrition Screen Tool  INTERVENTION: 1. Supplements: Ensure Enlive po BID, each supplement provides 350 kcal and 20 grams of protein  NUTRITION DIAGNOSIS: Unintentional weight loss related to sub-optimal intake as evidenced by pt report.   Goal: Pt to meet >/= 90% of their estimated nutrition needs.  Monitor:  PO intake  Assessment:  Pt admitted with schizophrenia. Pt not eating well d/t fear that food is being poisoned. Per chart review, pt has lost 23 lb but uncertain of time frame, as it is documented pt weighed 181 lb on 10/8. Will order Ensure supplements given poor PO intakes.   Height: Ht Readings from Last 1 Encounters:  09/09/17 5' 8.9" (1.75 m)    Weight: Wt Readings from Last 1 Encounters:  09/09/17 158 lb (71.7 kg)    Weight Hx: Wt Readings from Last 10 Encounters:  09/09/17 158 lb (71.7 kg)  09/06/17 181 lb (82.1 kg)    BMI:  Body mass index is 23.4 kg/m. Pt meets criteria for normal based on current BMI.  Estimated Nutritional Needs: Kcal: 25-30 kcal/kg Protein: > 1 gram protein/kg Fluid: 1 ml/kcal  Diet Order: Diet regular Room service appropriate? No; Fluid consistency: Thin Pt is also offered choice of unit snacks mid-morning and mid-afternoon.  Pt is eating as desired.   Lab results and medications reviewed.   Tilda Franco, MS, RD, LDN Pager: [phone] After Hours Pager: [phone]

## 2017-09-09 NOTE — BH Assessment (Signed)
Admission Note   Pt is a 36 y/o male admitted to the adult I/P unit. Pt at the time of admission denied anxiety, pain, depression, SI, HI or AVH; states, "I was brought here against my will." Patient is unable to give reasonable information due to paranoia and delusional thinking. When asked whether he smokes Pt answered; "I smoke about 16109 tons; smoke from the vans, cars and all the factories; people think I'm crazy because I drink my urine-I'm sure u know that urine is sterile" Pt admit to using THC and occasional drinking. Support, encouragement, and safe environment provided.  15-minute safety checks initiated and continued. Pt remained calm and cooperative through the admission process. Pt searched and no contrabands found.

## 2017-09-09 NOTE — Progress Notes (Signed)
Recreation Therapy Notes  INPATIENT RECREATION THERAPY ASSESSMENT  Patient Details Name: Stephen Harris MRN: [id number] DOB: [birth year] Today's Date: 09/09/2017  Patient Stressors: Other (Comment) ("Went to get alcohol for my butt for a terrorist breach")  Pt stated he was here because a Emergency planning/management officer brought him in.  Coping Skills:   Exercise, Talking  Personal Challenges:  (Patient didn't identify any personal challenges)  Leisure Interests (2+):  Social - Friends, Social - Family, Games - Other (Comment) (Ride bike)  Biochemist, clinical Resources:  Yes  Community Resources:  Public affairs consultant, Other (Comment) (Service stations)  Current Use: Yes  Patient Strengths:  Priviledge; Madam  Patient Identified Areas of Improvement:  Sound coordination; other people's body language  Current Recreation Participation:  Everyday  Patient Goal for Hospitalization:  "Observe others intentions, skills and wisdom"  [place] of Residence:  [place] of Residence:  [place]  Current [place] (including self-harm):  No  Current HI:  No  Consent to Intern Participation: N/A   Caroll Rancher, LRT/CTRS  Caroll Rancher A 09/09/2017, 2:12 PM

## 2017-09-09 NOTE — Tx Team (Addendum)
Initial Treatment Plan 09/09/2017 3:05 AM [name] [id number]    PATIENT STRESSORS: Financial difficulties Medication change or noncompliance Substance abuse   PATIENT STRENGTHS: Capable of independent living Communication skills   PATIENT IDENTIFIED PROBLEMS: "I want a room with a bed"  "I will take medication only if it's pure"  Psychosis.  Substance Abuse.               DISCHARGE CRITERIA:  Ability to meet basic life and health needs Safe-care adequate arrangements made Verbal commitment to aftercare and medication compliance  PRELIMINARY DISCHARGE PLAN: Return to previous living arrangement  PATIENT/FAMILY INVOLVEMENT: This treatment plan has been presented to and reviewed with the patient, Stephen Harris, and/or family member.  The patient and family have been given the opportunity to ask questions and make suggestions.  Eveline Keto, RN 09/09/2017, 3:05 AM

## 2017-09-09 NOTE — Progress Notes (Signed)
Patient ID: Stephen Harris, male   DOB: 07-17-1981, 36 y.o.   MRN: [id number]  Security guard Nedra Hai contacted me from [facility] and reported that he was calling about this patient's belongings. He reports that there are no belongings with patient's name but there is an unclaimed duffle bag and to check with patient to see what belongings he reported were across the street. Writer spoke with patient and he refused to give information into what he felt like he left at [facility]. Patient reports, "I'll take care of it when I get out of here. I took 2 or 3 packets of salt and pepper so I'm good. I have 16 more hours but the weather is bad so I might stay through the night." Writer contacted Security guard Nedra Hai back to report that patient refused to give any information about his belongings.

## 2017-09-09 NOTE — Progress Notes (Signed)
Recreation Therapy Notes  Date: 09/09/17 Time: 1000 Location: 500 Hall Dayroom  Group Topic: Communication, Team Building, Problem Solving  Goal Area(s) Addresses:  Patient will effectively work with peer towards shared goal.  Patient will identify skill used to make activity successful.  Patient will identify how skills used during activity can be used to reach post d/c goals.   Behavioral Response: Engaged  Intervention: STEM Activity   Activity: Straw Bridge. In teams, patients were asked to build a straw bridge that could hold a small puzzle box.  Each group was given 20 straws and 2 ft of masking tape to complete the project.    Education: Pharmacist, community, Building control surveyor.   Education Outcome: Acknowledges education/In group clarification offered/Needs additional education.   Clinical Observations/Feedback: Pt was engaged and very involved with the creation the groups bridge.  Pt would get off topic and not focus on what needed to be done to complete the bridge.  Peers were able to get pt to refocus and get back on task.  Pt came up with the idea of putting the crossing beams at the bottom of the bridge.   Caroll Rancher, LRT/CTRS         Caroll Rancher A 09/09/2017 12:07 PM
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
## 2017-09-09 NOTE — Progress Notes (Signed)
Adult Psychoeducational Group Note  Date:  09/09/2017 Time:  8:53 PM  Group Topic/Focus:  Wrap-Up Group:   The focus of this group is to help patients review their daily goal of treatment and discuss progress on daily workbooks.  Participation Level:  Active  Participation Quality:  Appropriate  Affect:  Appropriate  Cognitive:  Appropriate  Insight: Appropriate  Engagement in Group:  Engaged  Modes of Intervention:  Discussion  Additional Comments:  The patient expressed that he rates today a 9.The patient also said that he enjoyed the building a bridge group today.  Octavio Manns 09/09/2017, 8:53 PM

## 2017-09-09 NOTE — Progress Notes (Signed)
DAR NOTE: Patient presents with anxious affect and depressed mood. Pt has been observed pacing on the hallway most of the time. Pt stated he feels like he in the right place because has started to feel better. Pt is still endorsing thought blocking and  Delusional. Pt reports fair sleep, fair appetite, low energy and good concentration. Denies pain, positive for  auditory and visual hallucinations.  Rates depression at 2, hopelessness at 1, and anxiety at 1.  Maintained on routine safety checks.  Medications given as prescribed.  Support and encouragement offered as needed. States goal for today is "waltier read."  Patient observed socializing with peers in the dayroom.  Offered no complaint.
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
## 2017-09-10 DIAGNOSIS — G4700 Insomnia, unspecified: Secondary | ICD-10-CM

## 2017-09-10 DIAGNOSIS — R451 Restlessness and agitation: Secondary | ICD-10-CM

## 2017-09-10 LAB — PROLACTIN: Prolactin: 36.9 ng/mL — ABNORMAL HIGH (ref 4.0–15.2)

## 2017-09-10 LAB — HEMOGLOBIN A1C
Hgb A1c MFr Bld: 5.1 % (ref 4.8–5.6)
Mean Plasma Glucose: 99.67 mg/dL

## 2017-09-10 LAB — LIPID PANEL
CHOL/HDL RATIO: 2.6 ratio
CHOLESTEROL: 153 mg/dL (ref 0–200)
HDL: 58 mg/dL (ref >40)
LDL Cholesterol: 76 mg/dL (ref 0–99)
TRIGLYCERIDES: 97 mg/dL (ref <150)
VLDL: 19 mg/dL (ref 0–40)

## 2017-09-10 LAB — TSH: TSH: 2.731 u[IU]/mL (ref 0.350–4.500)

## 2017-09-10 MED ORDER — HALOPERIDOL DECANOATE 100 MG/ML IM SOLN
200.0000 mg | INTRAMUSCULAR | Status: DC
  Administered 2017-09-10: 200 mg via INTRAMUSCULAR
  Filled 2017-09-10: qty 2
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
## 2017-09-10 NOTE — [facility] Group Notes (Signed)
LCSW Group Therapy Note   09/10/2017 1:15pm   Type of Therapy and Topic:  Group Therapy:  Positive Affirmations   Participation Level:  Active  Description of Group: This group addressed positive affirmation toward self and others. Patients went around the room and identified two positive things about themselves and two positive things about a peer in the room. Patients reflected on how it felt to share something positive with others, to identify positive things about themselves, and to hear positive things from others. Patients were encouraged to have a daily reflection of positive characteristics or circumstances.  Therapeutic Goals 1. Patient will verbalize two of their positive qualities 2. Patient will demonstrate empathy for others by stating two positive qualities about a peer in the group 3. Patient will verbalize their feelings when voicing positive self affirmations and when voicing positive affirmations of others 4. Patients will discuss the potential positive impact on their wellness/recovery of focusing on positive traits of self and others. Summary of Patient Progress:  Stephen Harris attended group and was there the entire time.  He was an active participant.  Theme today was [facility].  For him hope is water.  He states that everything in life needs water.  He chose a picture of an electrical o'meter because it reminds him of power and balance.    Therapeutic Modalities Cognitive Behavioral Therapy Motivational Interviewing  Carlynn Herald Work 09/10/2017 1:28 PM

## 2017-09-10 NOTE — Progress Notes (Signed)
[facility] MD Progress Note  09/10/2017 3:16 PM Stephen Harris  MRN:  [id number]  Subjective: Stephen Harris reports, "I'm doing well. The skills testing for me is #5 or 6. I slept well till about 5:00 am. I'm taking the medicines. I have the had any kind of reactions from them. I know it is going to take a while for me to get used to them. The medicines has not gone through my stomach yet. I have always had good reactions with medicines, I just don't like to take them. Everybody trusted me & probably worried about where I'm. I'm pro modified with all these experiences. Do you happen to know when I'm going to be discharged from here?  Objective: 36 y.o.Caucasian male, single, lives alone. No past history of mental illness. 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 He expressed paranoid delusions  "the power company is out to get him". He says that he was supposed to "blow himself up before getting arrested".  "turn him into a piece of cheese in a trap". He was reported to have also expressed a lot of grandiose ideas of himself.  He was observed to drinking his own urine. Bizarre belief that it is the purest water available for him. Stephen Harris is seen in his room, chart reviewed. He is alert, oriented to self & presents bizarre still. However, there is some element of mental clarity present. He is asking more logical questions about discharge than on admission. He speech is clearer & appropriate. However, there remains some delusions & tangential ideations. He denies being or feeling depressed. He is worried about getting home & getting back to work. He is worried about his family & friends wondering where he is. He denies any medication adverse effects. He remains psychotic to some degree. Staff continues to provide support  Principal Problem: Schizophrenia,  paranoid (HCC)  Diagnosis:   Patient Active Problem List   Diagnosis Date Noted  . Schizophrenia, paranoid (HCC) [F20.0] 09/09/2017  . Paranoid schizophrenia (HCC) [F20.0] 09/07/2017  . Cannabis use disorder, moderate, dependence (HCC) [F12.20] 09/07/2017   Total Time spent with patient: 25 minutes  Past Psychiatric History: Schizophrenia, paranoid-type.  Past Medical History: History reviewed. No pertinent past medical history. History reviewed. No pertinent surgical history. Family History: History reviewed. No pertinent family history.  Family Psychiatric  History: See H&P  Social History:  History  Alcohol Use  . Yes    Comment: It varies     History  Drug Use  . Types: Marijuana    Social History   Social History  . Marital status: Single    Spouse name: N/A  . Number of children: N/A  . Years of education: N/A   Social History Main Topics  . Smoking status: Current Some Day Smoker    Packs/day: 1.00    Types: Cigarettes  . Smokeless tobacco: Never Used  . Alcohol use Yes     Comment: It varies  . Drug use: Yes    Types: Marijuana  . Sexual activity: Not Asked   Other Topics Concern  . None   Social History Narrative  . None   Additional Social History:  Pain Medications: None Prescriptions: None Over the Counter: None History of alcohol / drug use?: Yes Name of Substance 1: Marijuana 1 - Age of First Use: unknown 1 - Amount (  size/oz): Unknown 1 - Frequency: Unknown 1 - Duration: Unknown 1 - Last Use / Amount: Pt cannot answer clearly.  THC in UDS.  Sleep: Good  Appetite:  Good  Current Medications: Current Facility-Administered Medications  Medication Dose Route Frequency Provider Last Rate Last Dose  . acetaminophen (TYLENOL) tablet 650 mg  650 mg Oral Q6H PRN Laveda Abbe, NP      . alum & mag hydroxide-simeth (MAALOX/MYLANTA) 200-200-20 MG/5ML suspension 30 mL  30 mL Oral Q4H PRN Laveda Abbe, NP      . benztropine  (COGENTIN) tablet 1 mg  1 mg Oral BID Laveda Abbe, NP   1 mg at 09/10/17 1610   Or  . benztropine mesylate (COGENTIN) injection 1 mg  1 mg Intramuscular BID Laveda Abbe, NP      . diphenhydrAMINE (BENADRYL) injection 50 mg  50 mg Intravenous Once PRN Laveda Abbe, NP      . feeding supplement (ENSURE ENLIVE) (ENSURE ENLIVE) liquid 237 mL  237 mL Oral BID BM Nelly Rout, MD   237 mL at 09/10/17 1509  . gabapentin (NEURONTIN) capsule 300 mg  300 mg Oral BID Laveda Abbe, NP   300 mg at 09/10/17 9604  . haloperidol (HALDOL) tablet 5 mg  5 mg Oral BID Laveda Abbe, NP   5 mg at 09/10/17 5409  . haloperidol decanoate (HALDOL DECANOATE) 100 MG/ML injection 200 mg  200 mg Intramuscular Q30 days Georgiann Cocker, MD   200 mg at 09/10/17 1502  . haloperidol lactate (HALDOL) injection 10 mg  10 mg Intramuscular Once PRN Laveda Abbe, NP      . haloperidol lactate (HALDOL) injection 5 mg  5 mg Intramuscular BID Laveda Abbe, NP   Stopped at 09/10/17 [phone]  . hydrOXYzine (ATARAX/VISTARIL) tablet 25 mg  25 mg Oral BID Laveda Abbe, NP   25 mg at 09/10/17 1478  . LORazepam (ATIVAN) injection 2 mg  2 mg Intravenous Once PRN Laveda Abbe, NP      . magnesium hydroxide (MILK OF MAGNESIA) suspension 30 mL  30 mL Oral Daily PRN Laveda Abbe, NP      . traZODone (DESYREL) tablet 50 mg  50 mg Oral QHS Laveda Abbe, NP       Lab Results:  Results for orders placed or performed during the hospital encounter of 09/08/17 (from the past 48 hour(s))  Prolactin     Status: Abnormal   Collection Time: 09/09/17  6:37 PM  Result Value Ref Range   Prolactin 36.9 (H) 4.0 - 15.2 ng/mL    Comment: (NOTE) Performed At: [facility] [address] [place], [place] [id number] Mila Homer MD MV:[id number] Performed at [facility], [address] [place], [place] [address]   TSH     Status:  None   Collection Time: 09/10/17  6:34 AM  Result Value Ref Range   TSH 2.731 0.350 - 4.500 uIU/mL    Comment: Performed by a 3rd Generation assay with a functional sensitivity of <=0.01 uIU/mL. Performed at [facility], [address], [place], [place] [address]   Lipid panel     Status: None   Collection Time: 09/10/17  6:34 AM  Result Value Ref Range   Cholesterol 153 0 - 200 mg/dL   Triglycerides 97 <272 mg/dL   HDL 58 >53 mg/dL   Total CHOL/HDL Ratio 2.6 RATIO   VLDL 19 0 -  40 mg/dL   LDL Cholesterol 76 0 - 99 mg/dL    Comment:        Total Cholesterol/HDL:CHD Risk Coronary Heart Disease Risk Table                     Men   Women  1/2 Average Risk   3.4   3.3  Average Risk       5.0   4.4  2 X Average Risk   9.6   7.1  3 X Average Risk  23.4   11.0        Use the calculated Patient Ratio above and the CHD Risk Table to determine the patient's CHD Risk.        ATP III CLASSIFICATION (LDL):  <100     mg/dL   Optimal  409-811  mg/dL   Near or Above                    Optimal  130-159  mg/dL   Borderline  914-782  mg/dL   High  >956     mg/dL   Very High Performed at [facility], [address], [place], [place] [address]   Hemoglobin A1c     Status: None   Collection Time: 09/10/17  6:34 AM  Result Value Ref Range   Hgb A1c MFr Bld 5.1 4.8 - 5.6 %    Comment: (NOTE) Pre diabetes:          5.7%-6.4% Diabetes:              >6.4% Glycemic control for   <7.0% adults with diabetes    Mean Plasma Glucose 99.67 mg/dL    Comment: Performed at [facility], [address] [place], [place] [address]   Blood Alcohol level:  Lab Results  Component Value Date   ETH <10 09/06/2017   Metabolic Disorder Labs: Lab Results  Component Value Date   HGBA1C 5.1 09/10/2017   MPG 99.67 09/10/2017   Lab Results  Component Value Date   PROLACTIN 36.9 (H) 09/09/2017   Lab Results  Component Value Date   CHOL 153 09/10/2017   TRIG 97  09/10/2017   HDL 58 09/10/2017   CHOLHDL 2.6 09/10/2017   VLDL 19 09/10/2017   LDLCALC 76 09/10/2017   Physical Findings: AIMS: Facial and Oral Movements Muscles of Facial Expression: None, normal Lips and Perioral Area: None, normal Jaw: None, normal Tongue: None, normal,Extremity Movements Upper (arms, wrists, hands, fingers): None, normal Lower (legs, knees, ankles, toes): None, normal, Trunk Movements Neck, shoulders, hips: None, normal, Overall Severity Severity of abnormal movements (highest score from questions above): None, normal Incapacitation due to abnormal movements: None, normal Patient's awareness of abnormal movements (rate only patient's report): No Awareness, Dental Status Current problems with teeth and/or dentures?: Yes Does patient usually wear dentures?: No  CIWA:    COWS:     Musculoskeletal: Strength & Muscle Tone: within normal limits Gait & Station: normal Patient leans: N/A  Psychiatric Specialty Exam: Physical Exam  Nursing note and vitals reviewed.   Review of Systems  Psychiatric/Behavioral: Positive for hallucinations (Hx. psychosis) and substance abuse (Cannabis use disorder). Negative for depression (Denies feeling depresed), memory loss and suicidal ideas. The patient is nervous/anxious and has insomnia ("Improving").     Blood pressure 105/74, pulse 61, temperature 97.9 F (36.6 C), temperature source Oral, resp. rate 18, height 5' 8.9" (1.75 m), weight 71.7 kg (158 lb).Body  mass index is 23.4 kg/m.  General Appearance: Not in any distress, odd and bizarre mannerisms. No EPS.   Eye Contact:  Good  Speech: Clear and Coherent and Normal Rate  Volume:  Normal  Mood: Feels okay, "I'm not depressed"  Affect: Odd and has a psychotic quality.  Thought Process:  Some degree of disconnection.    Orientation:  Full (Time, Place, and Person)  Thought Content:  Illogical, Delusions and Paranoid Ideation  Suicidal Thoughts:  No  Homicidal  Thoughts:  Yes.  without intent/plan  Memory:  Unable to assess at this time.   Judgement:  Poor  Insight:  Shallow  Psychomotor Activity:  Normal  Concentration:  Concentration: Fair and Attention Span: Fair  Recall:  Unable to assess at this time.   Fund of Knowledge:  Fair  Language:  Fair  Akathisia:    Handed:    AIMS (if indicated):     Assets:  Physical Health  ADL's:  Fair  Cognition:  WNL  Sleep:  Number of Hours: 3.75     Treatment Plan Summary: Daily contact with patient to assess and evaluate symptoms and progress in treatment.  Will continue today 09/10/17 plan as below except where it is noted.  -Continue Cogentin 1 mg bid for mood control. -Continue Gabapentin 300 mg bid for agitation. -Continue Haldol decanoate 200 mg/ml IM Q 30 days 1st dose on 09-10-17 for mood control. -Continue Haldol 5 mg daily for mood control. -Hydroxyzine 25 mg prn for anxiety bid. -Trazodone 50 mg at bedtime for sleep.  - Continue 15 minutes observation for safety concerns - Encouraged to participate in milieu therapy and group therapy counseling sessions and also work with coping skills -  Develop treatment plan to decrease risk of relapse upon discharge and to reduce the need for readmission. -  Psycho-social education regarding relapse prevention and self care. - Health care follow up as needed for medical problems. - Restart home medications where appropriate.  Sanjuana Kava, NP, PMHNP, FNP-BC. 09/10/2017, 3:16 PM

## 2017-09-10 NOTE — Tx Team (Signed)
Interdisciplinary Treatment and Diagnostic Plan Update  09/10/2017 Time of Session: 9:01 AM  Stephen Harris MRN: [id number]  Principal Diagnosis: Schizophrenia  Secondary Diagnoses: Active Problems:   Schizophrenia, paranoid ([place])   Current Medications:  Current Facility-Administered Medications  Medication Dose Route Frequency Provider Last Rate Last Dose  . acetaminophen (TYLENOL) tablet 650 mg  650 mg Oral Q6H PRN Ethelene Hal, NP      . alum & mag hydroxide-simeth (MAALOX/MYLANTA) 200-200-20 MG/5ML suspension 30 mL  30 mL Oral Q4H PRN Ethelene Hal, NP      . benztropine (COGENTIN) tablet 1 mg  1 mg Oral BID Ethelene Hal, NP   1 mg at 09/10/17 4081   Or  . benztropine mesylate (COGENTIN) injection 1 mg  1 mg Intramuscular BID Ethelene Hal, NP      . diphenhydrAMINE (BENADRYL) injection 50 mg  50 mg Intravenous Once PRN Ethelene Hal, NP      . feeding supplement (ENSURE ENLIVE) (ENSURE ENLIVE) liquid 237 mL  237 mL Oral BID BM Hampton Abbot, MD      . gabapentin (NEURONTIN) capsule 300 mg  300 mg Oral BID Ethelene Hal, NP   300 mg at 09/10/17 4481  . haloperidol (HALDOL) tablet 5 mg  5 mg Oral BID Ethelene Hal, NP   5 mg at 09/10/17 8563  . haloperidol lactate (HALDOL) injection 10 mg  10 mg Intramuscular Once PRN Ethelene Hal, NP      . haloperidol lactate (HALDOL) injection 5 mg  5 mg Intramuscular BID Ethelene Hal, NP   Stopped at 09/10/17 [phone]  . hydrOXYzine (ATARAX/VISTARIL) tablet 25 mg  25 mg Oral BID Ethelene Hal, NP   25 mg at 09/10/17 0263  . LORazepam (ATIVAN) injection 2 mg  2 mg Intravenous Once PRN Ethelene Hal, NP      . magnesium hydroxide (MILK OF MAGNESIA) suspension 30 mL  30 mL Oral Daily PRN Ethelene Hal, NP      . traZODone (DESYREL) tablet 50 mg  50 mg Oral QHS Ethelene Hal, NP        PTA Medications: Prescriptions Prior to Admission   Medication Sig Dispense Refill Last Dose  . aspirin 325 MG tablet Take 325 mg by mouth every 4 (four) hours as needed for mild pain.   Unknown at Unknown time    Patient Stressors: Financial difficulties Medication change or noncompliance Substance abuse  Patient Strengths: Capable of independent living Communication skills  Treatment Modalities: Medication Management, Group therapy, Case management,  1 to 1 session with clinician, Psychoeducation, Recreational therapy.   Physician Treatment Plan for Primary Diagnosis: <principal problem not specified> Long Term Goal(s): Improvement in symptoms so as ready for discharge  Short Term Goals: Ability to identify changes in lifestyle to reduce recurrence of condition will improve Ability to disclose and discuss suicidal ideas Ability to demonstrate self-control will improve Ability to identify and develop effective coping behaviors will improve Compliance with prescribed medications will improve Ability to identify triggers associated with substance abuse/mental health issues will improve  Medication Management: Evaluate patient's response, side effects, and tolerance of medication regimen.  Therapeutic Interventions: 1 to 1 sessions, Unit Group sessions and Medication administration.  Evaluation of Outcomes: Progressing  Physician Treatment Plan for Secondary Diagnosis: Active Problems:   Schizophrenia, paranoid ([place])   Long Term Goal(s): Improvement in symptoms so as ready for discharge  Short Term Goals: Ability to identify changes in  lifestyle to reduce recurrence of condition will improve Ability to disclose and discuss suicidal ideas Ability to demonstrate self-control will improve Ability to identify and develop effective coping behaviors will improve Compliance with prescribed medications will improve Ability to identify triggers associated with substance abuse/mental health issues will improve  Medication Management:  Evaluate patient's response, side effects, and tolerance of medication regimen.  Therapeutic Interventions: 1 to 1 sessions, Unit Group sessions and Medication administration.  Evaluation of Outcomes: Progressing   RN Treatment Plan for Primary Diagnosis: Schizophrenia Long Term Goal(s): Knowledge of disease and therapeutic regimen to maintain health will improve  Short Term Goals: Ability to identify and develop effective coping behaviors will improve and Compliance with prescribed medications will improve  Medication Management: RN will administer medications as ordered by provider, will assess and evaluate patient's response and provide education to patient for prescribed medication. RN will report any adverse and/or side effects to prescribing provider.  Therapeutic Interventions: 1 on 1 counseling sessions, Psychoeducation, Medication administration, Evaluate responses to treatment, Monitor vital signs and CBGs as ordered, Perform/monitor CIWA, COWS, AIMS and Fall Risk screenings as ordered, Perform wound care treatments as ordered.  Evaluation of Outcomes: Progressing   LCSW Treatment Plan for Primary Diagnosis: Schizophrenia Long Term Goal(s): Safe transition to appropriate next level of care at discharge, Engage patient in therapeutic group addressing interpersonal concerns.  Short Term Goals: Engage patient in aftercare planning with referrals and resources  Therapeutic Interventions: Assess for all discharge needs, 1 to 1 time with Social worker, Explore available resources and support systems, Assess for adequacy in community support network, Educate family and significant other(s) on suicide prevention, Complete Psychosocial Assessment, Interpersonal group therapy.  Evaluation of Outcomes: Met  Return home, follow up outpt-[facility]   Progress in Treatment: Attending groups: Yes Participating in groups: Yes Taking medication as prescribed: Yes Toleration medication: Yes, no  side effects reported at this time Family/Significant other contact made: Yes Patient understands diagnosis:No  Limited insight Discussing patient identified problems/goals with staff: Yes Medical problems stabilized or resolved: Yes Denies suicidal/homicidal ideation: Yes Issues/concerns per patient self-inventory: None Other: N/A  New problem(s) identified: None identified at this time.   New Short Term/Long Term Goal(s): "I need to get back to my projects at home.The pump is broke and I have to get water from my neighbor."   Discharge Plan or Barriers:   Reason for Continuation of Hospitalization: Disorganization Paranoia Mood instability Medication stabilization   Estimated Length of Stay: Wednesday  Attendees: Patient: Stephen Harris 09/10/2017  9:01 AM  Physician: Leanord Hawking, MD 09/10/2017  9:01 AM  Nursing: Sena Hitch, RN 09/10/2017  9:01 AM  RN Care Manager: Lars Pinks, RN 09/10/2017  9:01 AM  Social Worker: Ripley Fraise 09/10/2017  9:01 AM  Recreational Therapist: Winfield Cunas 09/10/2017  9:01 AM  Other: Norberto Sorenson 09/10/2017  9:01 AM  Other:  09/10/2017  9:01 AM    Scribe for Treatment Team:  Roque Lias LCSW 09/10/2017 9:01 AM

## 2017-09-10 NOTE — Progress Notes (Signed)
D: Pt denies SI/HI/AVH. Pt is pleasant and cooperative. Pt appears to be responding at times, pt continues to present paranoid, suspicious, disorganized at times . Pt spent some time in the dayroom too self   A: Pt was offered support and encouragement. Pt was given scheduled medications. Pt was encourage to attend groups. Q 15 minute checks were done for safety.   R:Pt attends groups. Pt is taking medication during the day. Pt has no complaints. safety maintained on unit.

## 2017-09-10 NOTE — Progress Notes (Signed)
Recreation Therapy Notes  Date: 09/10/17 Time: 1015 Location: [address] Dayroom  Group Topic: Communication  Goal Area(s) Addresses:  Patient will effectively communicate with peers in group.  Patient will verbalize benefit of healthy communication. Patient will verbalize positive effect of healthy communication on post d/c goals.  Patient will identify communication techniques that made activity effective for group.   Behavioral Response: Engaged  Intervention:  [employer], writing utensils, blank paper  Activity:  Back to Back Drawings.  Patients were seated in pairs back to back.  One patient would give directions and the other patient would listen.  The patients doing the talking were given a geometrical picture which they were to give instructions for their partner to draw.  The patients listening where to draw the pictures being described.  Patients would switch roles on the second round.  Education: Communication, Discharge Planning  Education Outcome: Acknowledges understanding/In group clarification offered/Needs additional education.   Clinical Observations/Feedback: Pt worked well with his peer.  Pt was bright and active.  Pt was still making delusional statements.  Pt left early to meet with doctor and did not return.   Stephen Harris, LRT/CTRS         Stephen Harris, Stephen Harris A 09/10/2017 11:01 AM

## 2017-09-10 NOTE — Progress Notes (Signed)
DAR NOTE: Pt present with flat affect and depressed mood in the unit. Pt has been isolating himself. Pt has been observed just standing in the room at some point, pt has his fold under his shirt because he think someone might slip something in his folder. Pt denies physical pain, took all his meds as scheduled. As per self inventory, pt had a good night sleep, good appetite, normal energy, and good concentration. Pt rate depression at 01, hopeless ness at 01, and anxiety at 01. Pt's safety ensured with 15 minute and environmental checks. Pt currently denies SI/HI and A/V hallucinations. Pt verbally agrees to seek staff if SI/HI or A/VH occurs and to consult with staff before acting on these thoughts. Will continue POC.

## 2017-09-10 NOTE — Plan of Care (Signed)
Problem: Safety: Goal: Periods of time without injury will increase Outcome: Progressing Pt safe on the unit at this time  Problem: Coping: Goal: Ability to cope will improve Outcome: Progressing Pt has been seen sitting in the dayroom with peers, pt denies AVH at this time, but pt presents as if he is responding to internal stimuli at times

## 2017-09-11 DIAGNOSIS — R443 Hallucinations, unspecified: Secondary | ICD-10-CM

## 2017-09-11 MED ORDER — HALOPERIDOL LACTATE 5 MG/ML IJ SOLN: 5.0000 mg | Freq: Two times a day (BID) | INTRAMUSCULAR | Status: DC

## 2017-09-11 MED ORDER — ONDANSETRON 4 MG PO TBDP
4.0000 mg | ORAL_TABLET | Freq: Three times a day (TID) | ORAL | Status: DC | PRN
  Administered 2017-09-17: 4 mg via ORAL
  Filled 2017-09-11: qty 1

## 2017-09-11 MED ORDER — HALOPERIDOL 5 MG PO TABS
5.0000 mg | ORAL_TABLET | Freq: Two times a day (BID) | ORAL | Status: DC
  Administered 2017-09-11 – 2017-09-21 (×20): 5 mg via ORAL
  Filled 2017-09-11 (×23): qty 1

## 2017-09-11 NOTE — Progress Notes (Signed)
Patient ID: Stephen Harris, male   DOB: [birth year], 36 y.o.   MRN: [id number]    D: Pt has been very flat and depressed on the unit today. Pt has also been very isolative and remained in his room most of the day. Pt did take all mediation as prescribed. At dinner patient reported that he was nauseated, pt was given ginger ale. Pt reported that his depression was a 1, his hopelessness was a 1, and his anxiety was a 1. Pt reported that he had no goal for today. Pt reported being negative SI/HI, no AH/VH noted. A: 15 min checks continued for patient safety. R: Pt safety maintained.

## 2017-09-11 NOTE — [facility] Group Notes (Signed)
  [facility] LCSW Group Therapy Note  Date/Time:  09/11/2017 11:15AM-12:00PM  Type of Therapy and Topic:  Group Therapy:  Feelings About Hospitalization  Participation Level:  Active   Description of Group This process group involved patients discussing their feelings related to being hospitalized, as well as the benefits they see to being in the hospital.  These feelings and benefits were itemized.  The group then brainstormed specific ways in which they could seek those same benefits when they discharge and return home.  Therapeutic Goals 1. Patient will identify and describe positive and negative feelings related to hospitalization 2. Patient will verbalize benefits of hospitalization to themselves personally 3. Patients will brainstorm together ways they can obtain similar benefits in the outpatient setting, identify barriers to wellness and possible solutions  Summary of Patient Progress:  The patient expressed his primary feelings about being hospitalized are that it feels like being quarantined and locked up like livestock, and he wonders if he is going to be executed next.  He said he is trying to make the best of the situation.  He made numerous unrelated statements throughout group and was very excited about tangential and illogical statements made by another patient.  He stated he has to take a double dose of his medicine because somebody whether it is a person or a cat is going to come steal it out of his body at night.  Therapeutic Modalities Cognitive Behavioral Therapy Motivational Interviewing    Ambrose Mantle, LCSW 09/11/2017, 12:57 PM

## 2017-09-11 NOTE — Progress Notes (Signed)
D: Pt denies SI/HI/AVH. Pt is pleasant and cooperative. Pt flat, isolates , pt will sit in the dayroom at times. Pt remains very suspicious.   A: Pt was offered support and encouragement. Pt was given scheduled medications. Pt was encourage to attend groups. Q 15 minute checks were done for safety.   R:Pt attends groups and interacts well with peers and staff. Pt is taking medication. Pt has no complaints .Pt receptive to treatment and safety maintained on unit.

## 2017-09-11 NOTE — Progress Notes (Signed)
Psychoeducational Group Note  Date:  09/11/2017 Time:  2356  Group Topic/Focus:  Wrap-Up Group:   The focus of this group is to help patients review their daily goal of treatment and discuss progress on daily workbooks.  Participation Level: Did Not Attend  Participation Quality:  Not Applicable  Affect:  Not Applicable  Cognitive:  Not Applicable  Insight:  Not Applicable  Engagement in Group: Not Applicable  Additional Comments:  The patient did not attend group this evening and remained in his bedroom.   Hazle Coca S 09/11/2017, 11:56 PM

## 2017-09-11 NOTE — Progress Notes (Signed)
[facility] MD Progress Note  09/11/2017 1:56 PM Stephen Harris  MRN:  [id number]  Subjective: Stephen Harris reports, "I don't know why I am here? I was at [place] and then here. I feel ready to go okay." Patient reports " there is hot water in there" pointing  to shower  Objective: Stephen Harris is awake, alert. Seen resting in his bedroom. Pleasant and cooperative. Patient is guarded and paranoid. Seen responding to internal stimuli Reports taken medications however states " what the magic pill for"  Denies suicidal or homicidal ideation (nodding head - left to right). Denies auditory or visual hallucinations. However appear to be responding to internal stimuli intermittly. Reports good appetite and states he rested well on last night.Support, encouragement and reassurance was provided.   Principal Problem: Schizophrenia, paranoid (HCC)  Diagnosis:   Patient Active Problem List   Diagnosis Date Noted  . Schizophrenia, paranoid (HCC) [F20.0] 09/09/2017  . Paranoid schizophrenia (HCC) [F20.0] 09/07/2017  . Cannabis use disorder, moderate, dependence (HCC) [F12.20] 09/07/2017   Total Time spent with patient: 25 minutes  Past Psychiatric History: Schizophrenia, paranoid-type.  Past Medical History: History reviewed. No pertinent past medical history. History reviewed. No pertinent surgical history. Family History: History reviewed. No pertinent family history.  Family Psychiatric  History: See H&P  Social History:  History  Alcohol Use  . Yes    Comment: It varies     History  Drug Use  . Types: Marijuana    Social History   Social History  . Marital status: Single    Spouse name: N/A  . Number of children: N/A  . Years of education: N/A   Social History Main Topics  . Smoking status: Current Some Day Smoker    Packs/day: 1.00    Types: Cigarettes  . Smokeless tobacco: Never Used  . Alcohol use Yes     Comment: It varies  . Drug use: Yes    Types: Marijuana  . Sexual activity:  Not Asked   Other Topics Concern  . None   Social History Narrative  . None   Additional Social History:  Pain Medications: None Prescriptions: None Over the Counter: None History of alcohol / drug use?: Yes Name of Substance 1: Marijuana 1 - Age of First Use: unknown 1 - Amount (size/oz): Unknown 1 - Frequency: Unknown 1 - Duration: Unknown 1 - Last Use / Amount: Pt cannot answer clearly.  THC in UDS.  Sleep: Good  Appetite:  Good  Current Medications: Current Facility-Administered Medications  Medication Dose Route Frequency Provider Last Rate Last Dose  . acetaminophen (TYLENOL) tablet 650 mg  650 mg Oral Q6H PRN Laveda Abbe, NP      . alum & mag hydroxide-simeth (MAALOX/MYLANTA) 200-200-20 MG/5ML suspension 30 mL  30 mL Oral Q4H PRN Laveda Abbe, NP      . benztropine (COGENTIN) tablet 1 mg  1 mg Oral BID Laveda Abbe, NP   1 mg at 09/11/17 0800  . diphenhydrAMINE (BENADRYL) injection 50 mg  50 mg Intravenous Once PRN Laveda Abbe, NP      . feeding supplement (ENSURE ENLIVE) (ENSURE ENLIVE) liquid 237 mL  237 mL Oral BID BM Nelly Rout, MD   237 mL at 09/10/17 1509  . gabapentin (NEURONTIN) capsule 300 mg  300 mg Oral BID Laveda Abbe, NP   300 mg at 09/11/17 0800  . haloperidol (HALDOL) tablet 5 mg  5 mg Oral BID Oneta Rack, NP      .  haloperidol decanoate (HALDOL DECANOATE) 100 MG/ML injection 200 mg  200 mg Intramuscular Q30 days Izediuno, Vincent A, MD   200 mg at 09/10/17 1502  . haloperidol lactate (HALDOL) injection 10 mg  10 mg Intramuscular Once PRN Laveda Abbe, NP      . hydrOXYzine (ATARAX/VISTARIL) tablet 25 mg  25 mg Oral BID Laveda Abbe, NP   25 mg at 09/11/17 0800  . LORazepam (ATIVAN) injection 2 mg  2 mg Intravenous Once PRN Laveda Abbe, NP      . magnesium hydroxide (MILK OF MAGNESIA) suspension 30 mL  30 mL Oral Daily PRN Laveda Abbe, NP      . traZODone  (DESYREL) tablet 50 mg  50 mg Oral QHS Laveda Abbe, NP       Lab Results:  Results for orders placed or performed during the hospital encounter of 09/08/17 (from the past 48 hour(s))  Prolactin     Status: Abnormal   Collection Time: 09/09/17  6:37 PM  Result Value Ref Range   Prolactin 36.9 (H) 4.0 - 15.2 ng/mL    Comment: (NOTE) Performed At: [facility] [address] [place], [place] [id number] Mila Homer MD BJ:[id number] Performed at [facility], [address] [place], [place] [address]   TSH     Status: None   Collection Time: 09/10/17  6:34 AM  Result Value Ref Range   TSH 2.731 0.350 - 4.500 uIU/mL    Comment: Performed by a 3rd Generation assay with a functional sensitivity of <=0.01 uIU/mL. Performed at [facility], [address], [place], [place] [address]   Lipid panel     Status: None   Collection Time: 09/10/17  6:34 AM  Result Value Ref Range   Cholesterol 153 0 - 200 mg/dL   Triglycerides 97 <295 mg/dL   HDL 58 >28 mg/dL   Total CHOL/HDL Ratio 2.6 RATIO   VLDL 19 0 - 40 mg/dL   LDL Cholesterol 76 0 - 99 mg/dL    Comment:        Total Cholesterol/HDL:CHD Risk Coronary Heart Disease Risk Table                     Men   Women  1/2 Average Risk   3.4   3.3  Average Risk       5.0   4.4  2 X Average Risk   9.6   7.1  3 X Average Risk  23.4   11.0        Use the calculated Patient Ratio above and the CHD Risk Table to determine the patient's CHD Risk.        ATP III CLASSIFICATION (LDL):  <100     mg/dL   Optimal  413-244  mg/dL   Near or Above                    Optimal  130-159  mg/dL   Borderline  010-272  mg/dL   High  >536     mg/dL   Very High Performed at [facility], [address], [place], [place] [address]   Hemoglobin A1c     Status: None   Collection Time: 09/10/17  6:34 AM  Result Value Ref Range   Hgb A1c MFr Bld 5.1 4.8 - 5.6 %    Comment: (NOTE) Pre diabetes:  5.7%-6.4% Diabetes:              >6.4% Glycemic control for   <7.0% adults with diabetes    Mean Plasma Glucose 99.67 mg/dL    Comment: Performed at [facility], [address], [place], [place] [address]   Blood Alcohol level:  Lab Results  Component Value Date   ETH <10 09/06/2017   Metabolic Disorder Labs: Lab Results  Component Value Date   HGBA1C 5.1 09/10/2017   MPG 99.67 09/10/2017   Lab Results  Component Value Date   PROLACTIN 36.9 (H) 09/09/2017   Lab Results  Component Value Date   CHOL 153 09/10/2017   TRIG 97 09/10/2017   HDL 58 09/10/2017   CHOLHDL 2.6 09/10/2017   VLDL 19 09/10/2017   LDLCALC 76 09/10/2017   Physical Findings: AIMS: Facial and Oral Movements Muscles of Facial Expression: None, normal Lips and Perioral Area: None, normal Jaw: None, normal Tongue: None, normal,Extremity Movements Upper (arms, wrists, hands, fingers): None, normal Lower (legs, knees, ankles, toes): None, normal, Trunk Movements Neck, shoulders, hips: None, normal, Overall Severity Severity of abnormal movements (highest score from questions above): None, normal Incapacitation due to abnormal movements: None, normal Patient's awareness of abnormal movements (rate only patient's report): No Awareness, Dental Status Current problems with teeth and/or dentures?: Yes Does patient usually wear dentures?: No  CIWA:    COWS:     Musculoskeletal: Strength & Muscle Tone: within normal limits Gait & Station: normal Patient leans: N/A  Psychiatric Specialty Exam: Physical Exam  Nursing note and vitals reviewed. Constitutional: He appears well-developed.    Review of Systems  Psychiatric/Behavioral: Positive for hallucinations (Hx. psychosis) and substance abuse (Cannabis use disorder). Negative for depression (Denies feeling depresed), memory loss and suicidal ideas. The patient is nervous/anxious and has insomnia ("Improving").     Blood pressure 103/68,  pulse 73, temperature 97.8 F (36.6 C), resp. rate 16, height 5' 8.9" (1.75 m), weight 71.7 kg (158 lb).Body mass index is 23.4 kg/m.  General Appearance: guarded and bazaar  but pleasant.  Eye Contact:  Good  Speech: Clear and Coherent and Normal Rate  Volume:  Normal  Mood: reports ' I am fine and ready to leave, I was bought her for no reason."  Affect: Odd and has a psychotic quality.  Thought Process: disorganized    Orientation:  Full (Time, Place, and Person)  Thought Content:  Illogical, Delusions and Paranoid Ideation  Suicidal Thoughts:  No  Homicidal Thoughts: Denied during this assessment   Memory:  Unable to assess at this time.   Judgement:  Poor  Insight:  Shallow  Psychomotor Activity:  Normal  Concentration:  Concentration: Fair and Attention Span: Fair  Recall:  Unable to assess at this time.   Fund of Knowledge:  Fair  Language:  Fair  Akathisia:    Handed:    AIMS (if indicated):     Assets:  Physical Health  ADL's:  Fair  Cognition:  WNL  Sleep:  Number of Hours: 3.75       I agree with current treatment plan on 09/11/2017, Patient seen face-to-face for psychiatric evaluation follow-up, chart reviewed. Reviewed the information documented and agree with the treatment plan.   Treatment Plan Summary: Daily contact with patient to assess and evaluate symptoms and progress in treatment.  Will continue today 09/11/17 plan as below except where it is noted.  -Continue Cogentin 1 mg bid for mood stabilization  -Continue Gabapentin 300 mg  bid for agitation. -Continue Haldol decanoate 200 mg/ml IM Q 30 days 1st dose on 09-10-17 for mood control. -Continue Haldol 5 mg daily for mood control. -Hydroxyzine 25 mg prn for anxiety bid. -Trazodone 50 mg at bedtime for sleep.  - Continue 15 minutes observation for safety concerns - Encouraged to participate in milieu therapy and group therapy counseling sessions and also work with coping skills -  Develop treatment  plan to decrease risk of relapse upon discharge and to reduce the need for readmission. -  Psycho-social education regarding relapse prevention and self care. - Health care follow up as needed for medical problems. - Restart home medications where appropriate.  Oneta Rack, NP 09/11/2017, 1:56 PM

## 2017-09-12 NOTE — [facility] Group Notes (Signed)
[facility] LCSW Group Therapy Note  Date/Time:  09/12/2017  11:00AM-12:00PM  Type of Therapy and Topic:  Group Therapy:  Music and Mood  Participation Level:  Active   Description of Group: In this process group, members listened to a variety of genres of music and identified that different types of music evoke different responses.  Patients were encouraged to identify music that was soothing for them and music that was energizing for them.  Patients discussed how this knowledge can help with wellness and recovery in various ways including managing depression and anxiety as well as encouraging healthy sleep habits.    Therapeutic Goals: 1. Patients will explore the impact of different varieties of music on mood 2. Patients will verbalize the thoughts they have when listening to different types of music 3. Patients will identify music that is soothing to them as well as music that is energizing to them 4. Patients will discuss how to use this knowledge to assist in maintaining wellness and recovery 5. Patients will explore the use of music as a coping skill  Summary of Patient Progress:  At the beginning of group, patient expressed feeling "okay, better than before."  At the end of group he said he felt encouraged.  During the group when he commented on his feelings about songs, it was bizarre and disconnected, for instance after one dance song saying it made him feel he needed to go buy milk, and after one rock song saying it made him feel like asking people why he had an accident in the middle of the freeway.  Therapeutic Modalities: Solution Focused Brief Therapy Motivational Interviewing Activity   Ambrose Mantle, LCSW 09/12/2017 8:29 AM

## 2017-09-12 NOTE — Progress Notes (Signed)
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 Seen responding to internal stimuli. Per staff patient is medication compliant with Neurontin and Haldol. Patient continues to isolated to his room/ bathroom. Continues to deny auditory or visual hallucinations. However does appear to be responding to internal stimuli.  Reports good appetite and states he rested well on last night.Support, encouragement and reassurance was provided.   Principal Problem: Schizophrenia, paranoid (HCC)  Diagnosis:   Patient Active Problem List   Diagnosis Date Noted  . Schizophrenia, paranoid (HCC) [F20.0] 09/09/2017  . Paranoid schizophrenia (HCC) [F20.0] 09/07/2017  . Cannabis use disorder, moderate, dependence (HCC) [F12.20] 09/07/2017   Total Time spent with patient: 25 minutes  Past Psychiatric History: Schizophrenia, paranoid-type.  Past Medical History: History reviewed. No pertinent past medical history. History reviewed. No pertinent surgical history. Family History: History reviewed. No pertinent family history.  Family Psychiatric  History: See H&P  Social History:  History  Alcohol Use  . Yes    Comment: It varies     History  Drug Use  . Types: Marijuana    Social History   Social History  . Marital status: Single    Spouse name: N/A  . Number of children: N/A  . Years of education: N/A   Social History Main Topics  . Smoking status: Current Some Day Smoker    Packs/day: 1.00    Types: Cigarettes  . Smokeless tobacco: Never Used  . Alcohol use Yes     Comment: It varies  . Drug use: Yes    Types: Marijuana  . Sexual activity: Not Asked   Other Topics Concern  . None   Social History Narrative  . None   Additional Social History:  Pain Medications: None Prescriptions: None Over the Counter:  None History of alcohol / drug use?: Yes Name of Substance 1: Marijuana 1 - Age of First Use: unknown 1 - Amount (size/oz): Unknown 1 - Frequency: Unknown 1 - Duration: Unknown 1 - Last Use / Amount: Pt cannot answer clearly.  THC in UDS.  Sleep: Good  Appetite:  Good  Current Medications: Current Facility-Administered Medications  Medication Dose Route Frequency Provider Last Rate Last Dose  . acetaminophen (TYLENOL) tablet 650 mg  650 mg Oral Q6H PRN Laveda Abbe, NP      . alum & mag hydroxide-simeth (MAALOX/MYLANTA) 200-200-20 MG/5ML suspension 30 mL  30 mL Oral Q4H PRN Laveda Abbe, NP      . benztropine (COGENTIN) tablet 1 mg  1 mg Oral BID Laveda Abbe, NP   1 mg at 09/12/17 4098  . diphenhydrAMINE (BENADRYL) injection 50 mg  50 mg Intravenous Once PRN Laveda Abbe, NP      . feeding supplement (ENSURE ENLIVE) (ENSURE ENLIVE) liquid 237 mL  237 mL Oral BID BM Nelly Rout, MD   237 mL at 09/10/17 1509  . gabapentin (NEURONTIN) capsule 300 mg  300 mg Oral BID Laveda Abbe, NP   300 mg at 09/12/17 1191  . haloperidol (HALDOL) tablet 5 mg  5 mg Oral BID Oneta Rack, NP   5 mg at 09/12/17 4782  . haloperidol decanoate (HALDOL DECANOATE) 100 MG/ML injection 200 mg  200 mg Intramuscular Q30 days Georgiann Cocker, MD   200 mg at 09/10/17 1502  .  haloperidol lactate (HALDOL) injection 10 mg  10 mg Intramuscular Once PRN Laveda Abbe, NP      . hydrOXYzine (ATARAX/VISTARIL) tablet 25 mg  25 mg Oral BID Laveda Abbe, NP   25 mg at 09/12/17 1610  . LORazepam (ATIVAN) injection 2 mg  2 mg Intravenous Once PRN Laveda Abbe, NP      . magnesium hydroxide (MILK OF MAGNESIA) suspension 30 mL  30 mL Oral Daily PRN Laveda Abbe, NP      . ondansetron (ZOFRAN-ODT) disintegrating tablet 4 mg  4 mg Oral Q8H PRN Nira Conn A, NP      . traZODone (DESYREL) tablet 50 mg  50 mg Oral QHS Laveda Abbe, NP        Lab Results:  No results found for this or any previous visit (from the past 48 hour(s)). Blood Alcohol level:  Lab Results  Component Value Date   ETH <10 09/06/2017   Metabolic Disorder Labs: Lab Results  Component Value Date   HGBA1C 5.1 09/10/2017   MPG 99.67 09/10/2017   Lab Results  Component Value Date   PROLACTIN 36.9 (H) 09/09/2017   Lab Results  Component Value Date   CHOL 153 09/10/2017   TRIG 97 09/10/2017   HDL 58 09/10/2017   CHOLHDL 2.6 09/10/2017   VLDL 19 09/10/2017   LDLCALC 76 09/10/2017   Physical Findings: AIMS: Facial and Oral Movements Muscles of Facial Expression: None, normal Lips and Perioral Area: None, normal Jaw: None, normal Tongue: None, normal,Extremity Movements Upper (arms, wrists, hands, fingers): None, normal Lower (legs, knees, ankles, toes): None, normal, Trunk Movements Neck, shoulders, hips: None, normal, Overall Severity Severity of abnormal movements (highest score from questions above): None, normal Incapacitation due to abnormal movements: None, normal Patient's awareness of abnormal movements (rate only patient's report): No Awareness, Dental Status Current problems with teeth and/or dentures?: Yes Does patient usually wear dentures?: No  CIWA:    COWS:     Musculoskeletal: Strength & Muscle Tone: within normal limits Gait & Station: normal Patient leans: N/A  Psychiatric Specialty Exam: Physical Exam  Nursing note and vitals reviewed. Constitutional: He appears well-developed.    Review of Systems  Psychiatric/Behavioral: Positive for hallucinations (Hx. psychosis) and substance abuse (Cannabis use disorder). Negative for depression (Denies feeling depresed), memory loss and suicidal ideas. The patient is nervous/anxious and has insomnia ("Improving").     Blood pressure 109/69, pulse 94, temperature 98.3 F (36.8 C), temperature source Oral, resp. rate 18, height 5' 8.9" (1.75 m), weight 71.7 kg (158 lb).Body  mass index is 23.4 kg/m.  General Appearance: guarded and bazaar  but pleasant.  Eye Contact:  Good  Speech: Clear and Coherent and Normal Rate  Volume:  Normal  Mood: reports ' I am fine and ready to leave, I was bought her for no reason."  Affect: Odd and has a psychotic quality.  Thought Process: disorganized    Orientation:  Full (Time, Place, and Person)  Thought Content:  Illogical, Delusions and Paranoid Ideation  Suicidal Thoughts:  No  Homicidal Thoughts: Denied during this assessment   Memory:  Unable to assess at this time.   Judgement:  Poor  Insight:  Shallow  Psychomotor Activity:  Normal  Concentration:  Concentration: Fair and Attention Span: Fair  Recall:  Unable to assess at this time.   Fund of Knowledge:  Fair  Language:  Fair  Akathisia:    Handed:    AIMS (if indicated):  Assets:  Physical Health  ADL's:  Fair  Cognition:  WNL  Sleep:  Number of Hours: 3.75       I agree with current treatment plan on 09/12/2017, Patient seen face-to-face for psychiatric evaluation follow-up, chart reviewed. Reviewed the information documented and agree with the treatment plan.   Treatment Plan Summary: Daily contact with patient to assess and evaluate symptoms and progress in treatment.  Will continue today 09/12/17 plan as below except where it is noted.  -Continue Cogentin 1 mg bid for mood stabilization  -Continue Gabapentin 300 mg bid for agitation. -Continue Haldol decanoate 200 mg/ml IM Q 30 days 1st dose on 09-10-17 for mood control. -Continue Haldol 5 mg daily for mood control. -Hydroxyzine 25 mg prn for anxiety bid. -Trazodone 50 mg at bedtime for sleep.  - Continue 15 minutes observation for safety concerns - Encouraged to participate in milieu therapy and group therapy counseling sessions and also work with coping skills -  Develop treatment plan to decrease risk of relapse upon discharge and to reduce the need for readmission. -  Psycho-social  education regarding relapse prevention and self care. - Health care follow up as needed for medical problems. - Restart home medications where appropriate.  Oneta Rack, NP 09/12/2017, 11:14 AM

## 2017-09-12 NOTE — Progress Notes (Signed)
D:  Patient's self inventory sheet, patient has fair sleep, no sleep medication given.  Fair appetite, normal energy level, good concentration.  Rated depression, hopeless and anxiety #1.  Withdrawals, yes, no, checked diarrhea.  Denied SI.  Denied physical problems.  Denied physical pain.  Worst pain #1 in past 24 hours.  No pain medications.  No discharge plan. A:  Medications administered per MD orders.  Emotional support and encouragements. R:  Denied SI and HI, contracts for safety.  Denied A/V hallucinations.  Safety maintained with 15 minute checks.

## 2017-09-12 NOTE — Plan of Care (Signed)
Problem: Education: Goal: Will be free of psychotic symptoms Outcome: Progressing Nurse discussed depression/anxiety/coping skills with patient.   

## 2017-09-12 NOTE — Progress Notes (Signed)
D: Pt denies SI/HI/AVH. Pt is pleasant and cooperative. Pt continues to be paranoid and isolates  A: Pt was offered support and encouragement. Pt was encourage to attend groups. Q 15 minute checks were done for safety.   R: safety maintained on unit.

## 2017-09-13 NOTE — Plan of Care (Signed)
Problem: Safety: Goal: Ability to remain free from injury will improve Outcome: Progressing Patient is safe and free from injury.  Routine safety checks maintained every 15 minutes.   

## 2017-09-13 NOTE — Progress Notes (Signed)
Recreation Therapy Notes  Date: 09/13/17 Time: 1000 Location: 500 Hall Dayroom  Group Topic: Goal Setting  Goal Area(s) Addresses:  Patient will be able to identify at least 3 life goals.   Patient will be able to identify benefit of investing in life goals.  Patient will be able to identify benefit of setting life goals.   Behavioral Response:  Engaged  Intervention: Worksheets, pens  Activity: Life Goals.  Patients were given a worksheet with six categories (family, friends, work/school, spirituality, body and mental health).  Patients were to explain what they were doing well, what they needed to improve and make a goal for each category.    Education:  Discharge Planning, Pharmacologist, Leisure Education   Education Outcome: Acknowledges Education/In Group Clarification Provided/Needs Additional Education  Clinical Observations: Pt identified his top categories as family: does well at cruising together, needs to improve phone calls and set goal of making more phone calls to them; spirituality: pt stated he was "spiritually complete", needs to be able to see his family and set a goal of becoming more spiritual; and body: pt stated he can hear and see, pt stated he needs to improve food and labor and set a goal of using time and materials to "observe how food is processed".   Caroll Rancher, LRT/CTRS         Caroll Rancher A 09/13/2017 12:37 PM

## 2017-09-13 NOTE — [facility] Group Notes (Signed)
LCSW Group Therapy Note   09/13/2017 1:15pm   Type of Therapy and Topic:  Group Therapy:  Overcoming Obstacles   Participation Level:  Did Not Attend   Description of Group:    In this group patients will be encouraged to explore what they see as obstacles to their own wellness and recovery. They will be guided to discuss their thoughts, feelings, and behaviors related to these obstacles. The group will process together ways to cope with barriers, with attention given to specific choices patients can make. Each patient will be challenged to identify changes they are motivated to make in order to overcome their obstacles. This group will be process-oriented, with patients participating in exploration of their own experiences as well as giving and receiving support and challenge from other group members.   Therapeutic Goals: 1. Patient will identify personal and current obstacles as they relate to admission. 2. Patient will identify barriers that currently interfere with their wellness or overcoming obstacles.  3. Patient will identify feelings, thought process and behaviors related to these barriers. 4. Patient will identify two changes they are willing to make to overcome these obstacles:      Summary of Patient Progress Patient participated in a group exercise which identified challenges and identified positive/negative verbal, cognitive and behavioral responses.  Patient participated in a mindfulness exercise and a game designed to encourage making choices     Therapeutic Modalities:   Cognitive Behavioral Therapy Solution Focused Therapy Motivational Interviewing Relapse Prevention Therapy  Anne C Cunningham, LCSW 09/13/2017 1:11 PM  
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
## 2017-09-13 NOTE — Progress Notes (Signed)
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  Objective: Stephen Harris was evaluated by MD and NP. Reports his sister is in [place]. MD offered to call friend or family for collateral information. Patient reports he dosnt have anyone's number. Reports a family member came for visitation on yesterday. Patient  continues to deny depression, suicidal or homicidal ideations. Stephen Harris reports taken medication as directed and tolerating medications well. Support, encouragement and reassurance was provided.   Principal Problem: Schizophrenia, paranoid (HCC)  Diagnosis:   Patient Active Problem List   Diagnosis Date Noted  . Schizophrenia, paranoid (HCC) [F20.0] 09/09/2017  . Paranoid schizophrenia (HCC) [F20.0] 09/07/2017  . Cannabis use disorder, moderate, dependence (HCC) [F12.20] 09/07/2017   Total Time spent with patient: 25 minutes  Past Psychiatric History: Schizophrenia, paranoid-type.  Past Medical History: History reviewed. No pertinent past medical history. History reviewed. No pertinent surgical history. Family History: History reviewed. No pertinent family history.  Family Psychiatric  History: See H&P  Social History:  History  Alcohol Use  . Yes    Comment: It varies     History  Drug Use  . Types: Marijuana    Social History   Social History  . Marital status: Single    Spouse name: N/A  . Number of children: N/A  . Years of education: N/A   Social History Main Topics  . Smoking status: Current Some Day Smoker    Packs/day: 1.00    Types: Cigarettes  . Smokeless tobacco: Never Used  . Alcohol use Yes     Comment: It varies  . Drug use: Yes    Types: Marijuana  . Sexual activity: Not Asked   Other Topics Concern  . None   Social History Narrative  . None   Additional Social History:  Pain Medications: None Prescriptions:  None Over the Counter: None History of alcohol / drug use?: Yes Name of Substance 1: Marijuana 1 - Age of First Use: unknown 1 - Amount (size/oz): Unknown 1 - Frequency: Unknown 1 - Duration: Unknown 1 - Last Use / Amount: Pt cannot answer clearly.  THC in UDS.  Sleep: Good  Appetite:  Good  Current Medications: Current Facility-Administered Medications  Medication Dose Route Frequency Provider Last Rate Last Dose  . acetaminophen (TYLENOL) tablet 650 mg  650 mg Oral Q6H PRN Laveda Abbe, NP      . alum & mag hydroxide-simeth (MAALOX/MYLANTA) 200-200-20 MG/5ML suspension 30 mL  30 mL Oral Q4H PRN Laveda Abbe, NP      . benztropine (COGENTIN) tablet 1 mg  1 mg Oral BID Laveda Abbe, NP   1 mg at 09/13/17 0827  . diphenhydrAMINE (BENADRYL) injection 50 mg  50 mg Intravenous Once PRN Laveda Abbe, NP      . feeding supplement (ENSURE ENLIVE) (ENSURE ENLIVE) liquid 237 mL  237 mL Oral BID BM Nelly Rout, MD   237 mL at 09/13/17 1051  . gabapentin (NEURONTIN) capsule 300 mg  300 mg Oral BID Laveda Abbe, NP   300 mg at 09/13/17 0827  . haloperidol (HALDOL) tablet 5 mg  5 mg Oral BID Oneta Rack, NP   5 mg at 09/13/17 0827  . haloperidol decanoate (HALDOL DECANOATE) 100 MG/ML injection 200 mg  200 mg Intramuscular Q30 days Izediuno, Delight Ovens, MD   200 mg at  09/10/17 1502  . haloperidol lactate (HALDOL) injection 10 mg  10 mg Intramuscular Once PRN Laveda Abbe, NP      . hydrOXYzine (ATARAX/VISTARIL) tablet 25 mg  25 mg Oral BID Laveda Abbe, NP   25 mg at 09/13/17 0827  . LORazepam (ATIVAN) injection 2 mg  2 mg Intravenous Once PRN Laveda Abbe, NP      . magnesium hydroxide (MILK OF MAGNESIA) suspension 30 mL  30 mL Oral Daily PRN Laveda Abbe, NP      . ondansetron (ZOFRAN-ODT) disintegrating tablet 4 mg  4 mg Oral Q8H PRN Nira Conn A, NP      . traZODone (DESYREL) tablet 50 mg  50 mg Oral QHS  Laveda Abbe, NP       Lab Results:  No results found for this or any previous visit (from the past 48 hour(s)). Blood Alcohol level:  Lab Results  Component Value Date   ETH <10 09/06/2017   Metabolic Disorder Labs: Lab Results  Component Value Date   HGBA1C 5.1 09/10/2017   MPG 99.67 09/10/2017   Lab Results  Component Value Date   PROLACTIN 36.9 (H) 09/09/2017   Lab Results  Component Value Date   CHOL 153 09/10/2017   TRIG 97 09/10/2017   HDL 58 09/10/2017   CHOLHDL 2.6 09/10/2017   VLDL 19 09/10/2017   LDLCALC 76 09/10/2017   Physical Findings: AIMS: Facial and Oral Movements Muscles of Facial Expression: None, normal Lips and Perioral Area: None, normal Jaw: None, normal Tongue: None, normal,Extremity Movements Upper (arms, wrists, hands, fingers): None, normal Lower (legs, knees, ankles, toes): None, normal, Trunk Movements Neck, shoulders, hips: None, normal, Overall Severity Severity of abnormal movements (highest score from questions above): None, normal Incapacitation due to abnormal movements: None, normal Patient's awareness of abnormal movements (rate only patient's report): No Awareness, Dental Status Current problems with teeth and/or dentures?: Yes Does patient usually wear dentures?: No  CIWA:  CIWA-Ar Total: 2 COWS:  COWS Total Score: 2  Musculoskeletal: Strength & Muscle Tone: within normal limits Gait & Station: normal Patient leans: N/A  Psychiatric Specialty Exam: Physical Exam  Nursing note and vitals reviewed. Constitutional: He appears well-developed.    Review of Systems  Psychiatric/Behavioral: Positive for hallucinations (Hx. psychosis) and substance abuse (Cannabis use disorder). Negative for depression (Denies feeling depresed), memory loss and suicidal ideas. The patient is nervous/anxious and has insomnia ("Improving").     Blood pressure 118/77, pulse 89, temperature 97.7 F (36.5 C), resp. rate 16, height 5' 8.9"  (1.75 m), weight 71.7 kg (158 lb).Body mass index is 23.4 kg/m.  General Appearance: guarded but pleasant.  Eye Contact:  Good  Speech: Clear and Coherent and Normal Rate  Volume:  Normal  Mood:  " fine"  Affect: flat, depressed   Thought Process: coherent     Orientation:  Full (Time, Place, and Person)  Thought Content: linear   Suicidal Thoughts:  No  Homicidal Thoughts: Denied during this assessment   Memory:  Unable to assess at this time.   Judgement:  Poor  Insight:  Shallow  Psychomotor Activity:  Normal  Concentration:  Concentration: Fair and Attention Span: Fair  Recall:  Unable to assess at this time.   Fund of Knowledge:  Fair  Language:  Fair  Akathisia:    Handed:    AIMS (if indicated):     Assets:  Physical Health  ADL's:  Fair  Cognition:  WNL  Sleep:  Number of Hours: 3.75       I agree with current treatment plan on 09/13/2017, Patient seen face-to-face for psychiatric evaluation follow-up, chart reviewed. Reviewed the information documented and agree with the treatment plan.  Treatment Plan Summary: Daily contact with patient to assess and evaluate symptoms and progress in treatment.  Will continue today 09/13/17 plan as below except where it is noted.  -Continue Cogentin 1 mg bid for mood stabilization  -Continue Gabapentin 300 mg bid for agitation. -Continue Haldol decanoate 200 mg/ml IM Q 30 days 1st dose on 09-10-17 for mood control. -Continue Haldol 5 mg daily for mood control. -Hydroxyzine 25 mg prn for anxiety bid. -Trazodone 50 mg at bedtime for sleep.  - Continue 15 minutes observation for safety concerns - Encouraged to participate in milieu therapy and group therapy counseling sessions and also work with coping skills -  Develop treatment plan to decrease risk of relapse upon discharge and to reduce the need for readmission. -  Psycho-social education regarding relapse prevention and self care. - Health care follow up as needed for  medical problems. - Restart home medications where appropriate.  Oneta Rack, NP 09/13/2017, 3:04 PM Agree with NP Progress Note

## 2017-09-13 NOTE — Progress Notes (Signed)
DAR NOTE: Patient presents with flat affect and depressed mood.  Patient continues to be paranoid and isolates.  Denies pain, auditory and visual hallucinations.  Described energy level as low and concentration as poor.  Rates depression at 01, hopelessness at 01, and anxiety at 01.  Maintained on routine safety checks.  Medications given as prescribed.  Support and encouragement offered as needed.  Patient visible in milieu briefly.  Minimal interaction with staff and peers.

## 2017-09-13 NOTE — Progress Notes (Signed)
D: Pt continues to be suspicious, guarded and bizarre. Pt avoids and forwards little at this time A: Pt was offered support and encouragement.  Pt was encourage to attend groups. Q 15 minute checks were done for safety.   R: safety maintained on unit.

## 2017-09-14 DIAGNOSIS — R454 Irritability and anger: Secondary | ICD-10-CM

## 2017-09-14 NOTE — Progress Notes (Signed)
Recreation Therapy Notes  Date: 09/14/17 Time: 1000 Location: 500 Hall Dayroom  Group Topic: Coping Skills  Goal Area(s) Addresses:  Patients will be able to identify positive coping skills. Patients will be able to identify the benefits of coping skills. Patients will be able to identify benefits of using coping skills post d/c.  Behavioral Response: Minimal  Intervention: Worksheet, pens  Activity: Mind map.  Patients were given a blank mind map.  LRT and patients filled in the first eight boxes together.  Patients identified depression, anxiety, loneliness, homeless, anger, hearing voices, seeing things and overdose as things we need coping skills for.  Patients were to then come up with three coping skills for each of the eight problems identified.  Education: Pharmacologist, Building control surveyor.   Education Outcome: Acknowledges understanding/In group clarification offered/Needs additional education.   Clinical Observations/Feedback: Pt was pleasant and social during group.  Pt listened but needed redirection at times for getting off topic.  Pt answers had nothing to do with what we were discussing.  Pt would talk about random things.     Caroll Rancher, LRT/CTRS         Caroll Rancher A 09/14/2017 12:15 PM

## 2017-09-14 NOTE — Progress Notes (Signed)
DAR NOTE: Patient presents with flataffect and depressed mood.  Denies pain, auditory and visual hallucinations.  Described energy level as low and concentration as good.  Rates depression at 1, hopelessness at 1, and anxiety at 1.  Maintained on routine safety checks.  Medications given as prescribed.  Support and encouragement offered as needed.  Attended group and participated.  Patient is isolative and withdrawn.  Offered no complaint.

## 2017-09-14 NOTE — Progress Notes (Signed)
D: Pt denies SI/HI/AVH. Pt is pleasant and cooperative. Pt continues to be paranoid/ guarded . Pt stated he was feeling better and was ready for D/C , pt was encouraged to talk with the doctor.    A: Pt was offered support and encouragement. Pt was given scheduled medications. Pt was encourage to attend groups. Q 15 minute checks were done for safety.   R:Pt attends groups and interacts well with peers and staff. Pt is taking medication. Pt receptive to treatment and safety maintained on unit.

## 2017-09-14 NOTE — Progress Notes (Signed)
Pt did not attend wrap up group this evening.  

## 2017-09-14 NOTE — [facility] Group Notes (Signed)
LCSW Group Therapy Note   09/14/2017 1:15pm   Type of Therapy and Topic:  Group Therapy:  Positive Affirmations   Participation Level:  Minimal  Description of Group: This group addressed positive affirmation toward self and others. Patients went around the room and identified two positive things about themselves and two positive things about a peer in the room. Patients reflected on how it felt to share something positive with others, to identify positive things about themselves, and to hear positive things from others. Patients were encouraged to have a daily reflection of positive characteristics or circumstances.  Therapeutic Goals 1. Patient will verbalize two of their positive qualities 2. Patient will demonstrate empathy for others by stating two positive qualities about a peer in the group 3. Patient will verbalize their feelings when voicing positive self affirmations and when voicing positive affirmations of others 4. Patients will discuss the potential positive impact on their wellness/recovery of focusing on positive traits of self and others. Summary of Patient Progress:  Stayed the entire time, engaged throughout.  Word salad.  "Qualifying heat race, track and field, politics, special olympics, children with special needs from other countries."  Therapeutic Modalities Cognitive Behavioral Therapy Motivational Interviewing  Stephen Harris, [place] 09/14/2017 3:31 PM

## 2017-09-14 NOTE — Progress Notes (Signed)
[facility] MD Progress Note  09/14/2017 6:36 PM Stephen Harris  MRN:  [id number]  Subjective: Stephen Harris was met in his room for follow up. He reports that he feels he is being held against his will and convinced to take medication with the objective of "getting your machines to work correctly." Pt declines to offer further details; he appears paranoid and guarded. He explains that the air in the hospital has been tainted with "Hot Shot" which is an insecticide. He does not feel that the medications are helpful to him or have helped his clarity of thinking. He is irritable in discussion of any topic aside from his immediate release. Pt notes that he feels nauseated with some of the food on the unit, and we discussed availability of PRN medication to address any nausea he experiences. He denies SI/HI/AH/VH.    Objective: Stephen Harris was evaluated by MD. He had unkempt grooming, and intense eye contact. He was guarded and pressured at times during the interview. He was irritable and paranoid regarding the treatment team and delusion that he is being given medications as a way to make medical equipment in the hospital operate normally. He perseverates on discharge planning. His insight is poor.  Principal Problem: Schizophrenia, paranoid ([place])  Diagnosis:   Patient Active Problem List   Diagnosis Date Noted  . Schizophrenia, paranoid ([place]) [F20.0] 09/09/2017  . Paranoid schizophrenia ([place]) [F20.0] 09/07/2017  . Cannabis use disorder, moderate, dependence ([place]) [F12.20] 09/07/2017   Total Time spent with patient: 25 minutes  Past Psychiatric History: Schizophrenia, paranoid-type.  Past Medical History: History reviewed. No pertinent past medical history. History reviewed. No pertinent surgical history. Family History: History reviewed. No pertinent family history.  Family Psychiatric  History: See H&P  Social History:  History  Alcohol Use  . Yes    Comment: It varies     History  Drug Use  .  Types: Marijuana    Social History   Social History  . Marital status: Single    Spouse name: N/A  . Number of children: N/A  . Years of education: N/A   Social History Main Topics  . Smoking status: Current Some Day Smoker    Packs/day: 1.00    Types: Cigarettes  . Smokeless tobacco: Never Used  . Alcohol use Yes     Comment: It varies  . Drug use: Yes    Types: Marijuana  . Sexual activity: Not Asked   Other Topics Concern  . None   Social History Narrative  . None   Additional Social History:  Pain Medications: None Prescriptions: None Over the Counter: None History of alcohol / drug use?: Yes Name of Substance 1: Marijuana 1 - Age of First Use: unknown 1 - Amount (size/oz): Unknown 1 - Frequency: Unknown 1 - Duration: Unknown 1 - Last Use / Amount: Pt cannot answer clearly.  THC in UDS.  Sleep: Good  Appetite:  Good  Current Medications: Current Facility-Administered Medications  Medication Dose Route Frequency Provider Last Rate Last Dose  . acetaminophen (TYLENOL) tablet 650 mg  650 mg Oral Q6H PRN Ethelene Hal, NP      . alum & mag hydroxide-simeth (MAALOX/MYLANTA) 200-200-20 MG/5ML suspension 30 mL  30 mL Oral Q4H PRN Ethelene Hal, NP      . benztropine (COGENTIN) tablet 1 mg  1 mg Oral BID Ethelene Hal, NP   1 mg at 09/14/17 1717  . diphenhydrAMINE (BENADRYL) injection 50 mg  50 mg Intravenous Once  PRN Ethelene Hal, NP      . feeding supplement (ENSURE ENLIVE) (ENSURE ENLIVE) liquid 237 mL  237 mL Oral BID BM Hampton Abbot, MD   237 mL at 09/14/17 1335  . gabapentin (NEURONTIN) capsule 300 mg  300 mg Oral BID Ethelene Hal, NP   300 mg at 09/14/17 1717  . haloperidol (HALDOL) tablet 5 mg  5 mg Oral BID Derrill Center, NP   5 mg at 09/14/17 1717  . haloperidol decanoate (HALDOL DECANOATE) 100 MG/ML injection 200 mg  200 mg Intramuscular Q30 days Izediuno, Laruth Bouchard, MD   200 mg at 09/10/17 1502  . haloperidol  lactate (HALDOL) injection 10 mg  10 mg Intramuscular Once PRN Ethelene Hal, NP      . hydrOXYzine (ATARAX/VISTARIL) tablet 25 mg  25 mg Oral BID Ethelene Hal, NP   25 mg at 09/14/17 1717  . LORazepam (ATIVAN) injection 2 mg  2 mg Intravenous Once PRN Ethelene Hal, NP      . magnesium hydroxide (MILK OF MAGNESIA) suspension 30 mL  30 mL Oral Daily PRN Ethelene Hal, NP      . ondansetron (ZOFRAN-ODT) disintegrating tablet 4 mg  4 mg Oral Q8H PRN Lindon Romp A, NP      . traZODone (DESYREL) tablet 50 mg  50 mg Oral QHS Ethelene Hal, NP       Lab Results:  No results found for this or any previous visit (from the past 69 hour(s)). Blood Alcohol level:  Lab Results  Component Value Date   ETH <10 77/82/4235   Metabolic Disorder Labs: Lab Results  Component Value Date   HGBA1C 5.1 09/10/2017   MPG 99.67 09/10/2017   Lab Results  Component Value Date   PROLACTIN 36.9 (H) 09/09/2017   Lab Results  Component Value Date   CHOL 153 09/10/2017   TRIG 97 09/10/2017   HDL 58 09/10/2017   CHOLHDL 2.6 09/10/2017   VLDL 19 09/10/2017   LDLCALC 76 09/10/2017   Physical Findings: AIMS: Facial and Oral Movements Muscles of Facial Expression: None, normal Lips and Perioral Area: None, normal Jaw: None, normal Tongue: None, normal,Extremity Movements Upper (arms, wrists, hands, fingers): None, normal Lower (legs, knees, ankles, toes): None, normal, Trunk Movements Neck, shoulders, hips: None, normal, Overall Severity Severity of abnormal movements (highest score from questions above): None, normal Incapacitation due to abnormal movements: None, normal Patient's awareness of abnormal movements (rate only patient's report): No Awareness, Dental Status Current problems with teeth and/or dentures?: Yes Does patient usually wear dentures?: No  CIWA:  CIWA-Ar Total: 2 COWS:  COWS Total Score: 2  Musculoskeletal: Strength & Muscle Tone: within  normal limits Gait & Station: normal Patient leans: N/A  Psychiatric Specialty Exam: Physical Exam  Nursing note and vitals reviewed. Constitutional: He appears well-developed.    Review of Systems  Constitutional: Negative.   Cardiovascular: Negative.   Gastrointestinal: Negative.   Skin: Negative.   Psychiatric/Behavioral: Depression: Denies feeling depresed. Hallucinations: Hx. psychosis. Substance abuse: Cannabis use disorder. Insomnia: "Improving"     Blood pressure 94/77, pulse 98, temperature 98.4 F (36.9 C), temperature source Oral, resp. rate 20, height 5' 8.9" (1.75 m), weight 71.7 kg (158 lb).Body mass index is 23.4 kg/m.  General Appearance: unkempt  Eye Contact:  intense  Speech: Clear and Coherent and Normal Rate, pressured at times  Volume:  Normal  Mood:  " OK", irritable  Affect: flat, depressed, constrixcted  Thought Process: coherent     Orientation:  Full (Time, Place, and Person)  Thought Content: linear   Suicidal Thoughts:  No  Homicidal Thoughts: Denied during this assessment   Memory:  Unable to assess at this time.   Judgement:  Poor  Insight:  Shallow  Psychomotor Activity:  Normal  Concentration:  Concentration: Fair and Attention Span: Fair  Recall:  Unable to assess at this time.   Fund of Knowledge:  Fair  Language:  Fair  Akathisia:    Handed:    AIMS (if indicated):     Assets:  Physical Health  ADL's:  Fair  Cognition:  WNL  Sleep:  Number of Hours: 3.75      Treatment Plan Summary: Daily contact with patient to assess and evaluate symptoms and progress in treatment.  Will continue today 09/14/17 plan as below except where it is noted.  -Continue Cogentin 1 mg bid for mood stabilization  -Continue Gabapentin 300 mg bid for agitation. -Continue Haldol decanoate 200 mg/ml IM Q 30 days 1st dose on 09-10-17 for mood control. -Continue Haldol 5 mg daily for mood control. -Hydroxyzine 25 mg prn for anxiety bid. -Trazodone 50 mg  at bedtime for sleep.  - Continue 15 minutes observation for safety concerns - Encouraged to participate in milieu therapy and group therapy counseling sessions and also work with coping skills -  Develop treatment plan to decrease risk of relapse upon discharge and to reduce the need for readmission. -  Psycho-social education regarding relapse prevention and self care. - Health care follow up as needed for medical problems. - Restart home medications where appropriate.  Pennelope Bracken, MD 09/14/2017, 6:36 PM

## 2017-09-14 NOTE — Plan of Care (Signed)
Problem: Safety: Goal: Ability to remain free from injury will improve Outcome: Progressing Patient is safe and free from injury.   

## 2017-09-14 NOTE — Plan of Care (Signed)
Problem: Education: Goal: Will be free of psychotic symptoms Outcome: Progressing Pt denies AVH at this time, pt ap[pears to be responding at times.

## 2017-09-14 NOTE — Plan of Care (Signed)
Problem: Safety: Goal: Periods of time without injury will increase Outcome: Progressing Pt safe on the unit at this time   

## 2017-09-15 NOTE — Progress Notes (Signed)
Patient ID: Stephen Harris, male   DOB: 16-Mar-1981, 36 y.o.   MRN: [id number]  [facility] MD Progress Note  09/15/2017 7:04 PM Kwan Shellhammer  MRN:  [id number]  Subjective: [name] met for follow up. 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 He denies SI/HI/AH/VH. He has some poor insight regarding concern that he was drinking his own urine at time of presentation. He still endorses paranoia that the air in the hospital has been tainted with an insecticide. He feels that medications may not be helping him but he is in good spirits and when discussing that he has improvement of thought organization, he is in agreement.   Objective: Shona Needles was evaluated by MD. He had unkempt grooming, and intense eye contact. He was guarded and pressured at times during the interview. He was irritable and paranoid but showed improvement overall from previous interaction. He is less perseverative on discharge planning. His insight is poor.  Principal Problem: Schizophrenia, paranoid ([place])  Diagnosis:   Patient Active Problem List   Diagnosis Date Noted  . Schizophrenia, paranoid ([place]) [F20.0] 09/09/2017  . Paranoid schizophrenia ([place]) [F20.0] 09/07/2017  . Cannabis use disorder, moderate, dependence ([place]) [F12.20] 09/07/2017   Total Time spent with patient: 25 minutes  Past Psychiatric History: Schizophrenia, paranoid-type.  Past Medical History: History reviewed. No pertinent past medical history. History reviewed. No pertinent surgical history. Family History: History reviewed. No pertinent family history.  Family Psychiatric  History: See H&P  Social History:  History  Alcohol Use  . Yes    Comment: It varies     History  Drug Use  . Types: Marijuana    Social History   Social History  . Marital status: Single    Spouse name: N/A  . Number of children: N/A  . Years of education: N/A   Social History Main Topics   . Smoking status: Current Some Day Smoker    Packs/day: 1.00    Types: Cigarettes  . Smokeless tobacco: Never Used  . Alcohol use Yes     Comment: It varies  . Drug use: Yes    Types: Marijuana  . Sexual activity: Not Asked   Other Topics Concern  . None   Social History Narrative  . None   Additional Social History:  Pain Medications: None Prescriptions: None Over the Counter: None History of alcohol / drug use?: Yes Name of Substance 1: Marijuana 1 - Age of First Use: unknown 1 - Amount (size/oz): Unknown 1 - Frequency: Unknown 1 - Duration: Unknown 1 - Last Use / Amount: Pt cannot answer clearly.  THC in UDS.  Sleep: Good  Appetite:  Good  Current Medications: Current Facility-Administered Medications  Medication Dose Route Frequency Provider Last Rate Last Dose  . acetaminophen (TYLENOL) tablet 650 mg  650 mg Oral Q6H PRN Ethelene Hal, NP      . alum & mag hydroxide-simeth (MAALOX/MYLANTA) 200-200-20 MG/5ML suspension 30 mL  30 mL Oral Q4H PRN Ethelene Hal, NP      . benztropine (COGENTIN) tablet 1 mg  1 mg Oral BID Ethelene Hal, NP   1 mg at 09/15/17 1658  . diphenhydrAMINE (BENADRYL) injection 50 mg  50 mg Intravenous Once PRN Ethelene Hal, NP      . feeding supplement (ENSURE ENLIVE) (ENSURE ENLIVE) liquid 237 mL  237 mL Oral BID BM Hampton Abbot, MD  237 mL at 09/15/17 0819  . gabapentin (NEURONTIN) capsule 300 mg  300 mg Oral BID Ethelene Hal, NP   300 mg at 09/15/17 1658  . haloperidol (HALDOL) tablet 5 mg  5 mg Oral BID Derrill Center, NP   5 mg at 09/15/17 1658  . haloperidol decanoate (HALDOL DECANOATE) 100 MG/ML injection 200 mg  200 mg Intramuscular Q30 days Izediuno, Laruth Bouchard, MD   200 mg at 09/10/17 1502  . haloperidol lactate (HALDOL) injection 10 mg  10 mg Intramuscular Once PRN Ethelene Hal, NP      . hydrOXYzine (ATARAX/VISTARIL) tablet 25 mg  25 mg Oral BID Ethelene Hal, NP   25 mg  at 09/15/17 1658  . LORazepam (ATIVAN) injection 2 mg  2 mg Intravenous Once PRN Ethelene Hal, NP      . magnesium hydroxide (MILK OF MAGNESIA) suspension 30 mL  30 mL Oral Daily PRN Ethelene Hal, NP      . ondansetron (ZOFRAN-ODT) disintegrating tablet 4 mg  4 mg Oral Q8H PRN Lindon Romp A, NP      . traZODone (DESYREL) tablet 50 mg  50 mg Oral QHS Ethelene Hal, NP       Lab Results:  No results found for this or any previous visit (from the past 24 hour(s)). Blood Alcohol level:  Lab Results  Component Value Date   ETH <10 54/27/0623   Metabolic Disorder Labs: Lab Results  Component Value Date   HGBA1C 5.1 09/10/2017   MPG 99.67 09/10/2017   Lab Results  Component Value Date   PROLACTIN 36.9 (H) 09/09/2017   Lab Results  Component Value Date   CHOL 153 09/10/2017   TRIG 97 09/10/2017   HDL 58 09/10/2017   CHOLHDL 2.6 09/10/2017   VLDL 19 09/10/2017   LDLCALC 76 09/10/2017   Physical Findings: AIMS: Facial and Oral Movements Muscles of Facial Expression: None, normal Lips and Perioral Area: None, normal Jaw: None, normal Tongue: None, normal,Extremity Movements Upper (arms, wrists, hands, fingers): None, normal Lower (legs, knees, ankles, toes): None, normal, Trunk Movements Neck, shoulders, hips: None, normal, Overall Severity Severity of abnormal movements (highest score from questions above): None, normal Incapacitation due to abnormal movements: None, normal Patient's awareness of abnormal movements (rate only patient's report): No Awareness, Dental Status Current problems with teeth and/or dentures?: Yes Does patient usually wear dentures?: No  CIWA:  CIWA-Ar Total: 2 COWS:  COWS Total Score: 2  Musculoskeletal: Strength & Muscle Tone: within normal limits Gait & Station: normal Patient leans: N/A  Psychiatric Specialty Exam: Physical Exam  Nursing note and vitals reviewed. Constitutional: He appears well-developed.     Review of Systems  Constitutional: Negative.   Cardiovascular: Negative.   Gastrointestinal: Negative.   Skin: Negative.   Psychiatric/Behavioral: Depression: Denies feeling depresed. Hallucinations: Hx. psychosis. Substance abuse: Cannabis use disorder. Insomnia: "Improving"     Blood pressure 118/81, pulse 99, temperature 98.6 F (37 C), temperature source Oral, resp. rate 17, height 5' 8.9" (1.75 m), weight 71.7 kg (158 lb).Body mass index is 23.4 kg/m.  General Appearance: unkempt  Eye Contact:  intense  Speech: Clear and Coherent and Normal Rate, pressured at times  Volume:  Normal  Mood:  " OK", irritable  Affect: flat, depressed, constrixcted   Thought Process: coherent     Orientation:  Full (Time, Place, and Person)  Thought Content: linear   Suicidal Thoughts:  No  Homicidal Thoughts: Denied during this assessment  Memory:  Unable to assess at this time.   Judgement:  Poor  Insight:  Shallow  Psychomotor Activity:  Normal  Concentration:  Concentration: Fair and Attention Span: Fair  Recall:  Unable to assess at this time.   Fund of Knowledge:  Fair  Language:  Fair  Akathisia:    Handed:    AIMS (if indicated):     Assets:  Physical Health  ADL's:  Fair  Cognition:  WNL  Sleep:  Number of Hours: 3.75      Treatment Plan Summary: Daily contact with patient to assess and evaluate symptoms and progress in treatment.  Will continue today 09/15/17 plan as below except where it is noted.  -Continue Cogentin 1 mg bid for mood stabilization  -Continue Gabapentin 300 mg bid for agitation. -Continue Haldol decanoate 200 mg/ml IM Q 30 days 1st dose on 09-10-17 for mood control. -Continue Haldol 5 mg daily for mood control. -Hydroxyzine 25 mg prn for anxiety bid. -Trazodone 50 mg at bedtime for sleep.  - Continue 15 minutes observation for safety concerns - Encouraged to participate in milieu therapy and group therapy counseling sessions and also work with coping  skills -  Develop treatment plan to decrease risk of relapse upon discharge and to reduce the need for readmission. -  Psycho-social education regarding relapse prevention and self care. - Health care follow up as needed for medical problems. - Restart home medications where appropriate.  Pennelope Bracken, MD 09/15/2017, 7:04 PM

## 2017-09-15 NOTE — Progress Notes (Signed)
Pt did not attend wrap-up group   

## 2017-09-15 NOTE — Progress Notes (Signed)
Recreation Therapy Notes  Date: 09/15/17 Time: 1000 Location: 500 Hall Dayroom  Group Topic: Communication, Team Building, Problem Solving  Goal Area(s) Addresses:  Patient will effectively work with peer towards shared goal.  Patient will identify skills used to make activity successful.  Patient will identify how skills used during activity can be used to reach post d/c goals.   Behavioral Response: Engaged  Intervention: STEM Activity  Activity: Stage managerLanding Pad. In teams patients were given 12 plastic drinking straws and a length of masking tape. Using the materials provided patients were asked to build a landing pad to catch a golf ball dropped from approximately 6 feet in the air.   Education: Pharmacist, communityocial Skills, Discharge Planning   Education Outcome: Acknowledges education/In group clarification offered/Needs additional education.   Clinical Observations/Feedback:  Pt came up with the idea for his groups landing pad.  Pt worked with the group to come up with concepts to build the landing pad.  Pt was pleasant and bright during group.    Caroll RancherMarjette Oktober Glazer, LRT/CTRS         Caroll RancherLindsay, Aleister Lady A 09/15/2017 12:48 PM

## 2017-09-15 NOTE — [facility] Group Notes (Signed)
LCSW Group Therapy Note  09/15/2017 1:15pm  Type of Therapy/Topic:  Group Therapy:  Balance in Life  Participation Level:  Did Not Attend  Description of Group:    This group will address the concept of balance and how it feels and looks when one is unbalanced. Patients will be encouraged to process areas in their lives that are out of balance and identify reasons for remaining unbalanced. Facilitators will guide patients in utilizing problem-solving interventions to address and correct the stressor making their life unbalanced. Understanding and applying boundaries will be explored and addressed for obtaining and maintaining a balanced life. Patients will be encouraged to explore ways to assertively make their unbalanced needs known to significant others in their lives, using other group members and facilitator for support and feedback.  Therapeutic Goals: 1. Patient will identify two or more emotions or situations they have that consume much of in their lives. 2. Patient will identify signs/triggers that life has become out of balance:  3. Patient will identify two ways to set boundaries in order to achieve balance in their lives:  4. Patient will demonstrate ability to communicate their needs through discussion and/or role plays  Summary of Patient Progress:      Therapeutic Modalities:   Cognitive Behavioral Therapy Solution-Focused Therapy Assertiveness Training  Stephen Harris, Student-Social Work 09/15/2017 1:36 PM

## 2017-09-15 NOTE — Progress Notes (Signed)
Patient ID: Stephen Harris, male   DOB: 12-19-1980, 36 y.o.   MRN: [id number]  DAR: Pt. Denies SI/HI and A/V Hallucinations. He reports that his sleep is fair, appetite is poor, energy level is low, and concentration is poor. He rates his depression, hopelessness, and anxiety level is 1/10. Patient reports pain 1/10. He refuses intervention at this time. He reports that he feels like he's been "put in a basketball." Writer inquired if he felt like he was being bounced around with his medications. He stated, "no, it's like I'm being bounced around in a basketball." Patient continues to present with guarded and suspicious mood. He does have loose associations and appears paranoid. Support and encouragement provided to the patient however patient remains minimal with Clinical research associatewriter. Scheduled medication administered to patient per physician's orders. Q15 minute checks are maintained for safety.

## 2017-09-16 NOTE — Progress Notes (Signed)
DAR NOTE: Patient presents with flat affect and depressed mood.  Denies pain,suicidal thoughts, auditory and visual hallucinations.  Described energy level as low and concentration as poor.  Rates depression at 01, hopelessness at 01, and anxiety at 01.  Maintained on routine safety checks.  Medications given as prescribed.  Support and encouragement offered as needed.  Attended group and participated.  States goal for today is 01.  Patient visible in the dayroom interacting and watching TV.  Offered no complaint.

## 2017-09-16 NOTE — Progress Notes (Signed)
Recreation Therapy Notes  Date: 09/16/17 Time: 1000 Location: 500 Hall Dayroom  Group Topic: Wellness  Goal Area(s) Addresses:  Patient will define components of whole wellness. Patient will verbalize benefit of whole wellness.  Behavioral Response: Engaged  Intervention:  2 Decks of Cards  Activity: Deck of Chance.  Patients were given 2 cards from one deck of cards.  LRT would pull a card from another deck of cards.  Whatever card LRT pulled, patients with the corresponding card would have to complete the associated exercise.  Education: Wellness, Building control surveyorDischarge Planning.   Education Outcome: Acknowledges education/In group clarification offered/Needs additional education.   Clinical Observations/Feedback: Pt was pleasant and bright.  Pt was able to remain on task.  Pt completed his exercises but very slowly   Caroll RancherMarjette Wyndi Northrup, LRT/CTRS         Caroll RancherLindsay, Youa Deloney A 09/16/2017 12:24 PM

## 2017-09-16 NOTE — Plan of Care (Signed)
Problem: Safety: Goal: Ability to remain free from injury will improve Outcome: Progressing Patient is safe and free from injury.   

## 2017-09-16 NOTE — Progress Notes (Signed)
Patient ID: Stephen Harris, male   DOB: 11-Aug-1981, 36 y.o.   MRN: [id number]   [facility] MD Progress Note  09/16/2017 4:45 PM Stephen Harris  MRN:  [id number]  Subjective: Cord was met for follow up and he reports feeling "a bit better today." He notes that he is tolerating his medications without difficulty and he has not been having any more nausea. He denies SI/HI/AH/VH. He remains focused on discharge, but he acknowledges that the clarity of his thinking has improved since being in the hospital. He continues to endorse paranoia that the air in the hospital has been tainted with an insecticide, but he notes that it has not been bothering him today. He is unsure if medications have been helping him, but he is encouraged to no longer feel as if he is having side effects. He has spoken with his father and he heard that his sister is coming to be with him and for him to possibly stay with his sister. He makes somewhat disorganized statement, "You know, you have to find your [place] sister on your farm and in your blood;" however, pt is generally organized and linear in his thought process. Pt states that he would be open to staying with his sister in [place]. He had no further questions, comments, or concerns.   Objective:  He had fair grooming, and normal eye contact. He mostly linear and goal-oriented but he also had some disorganized statements. He appeared less guarded and less pressured during the interview compared to previous interactions. He demonstrated improvement of his irritability. He was less paranoid regarding delusions that he is being poisoned in the hospital. He is less perseverative on discharge planning. His insight is poor.  Principal Problem: Schizophrenia, paranoid ([place])  Diagnosis:   Patient Active Problem List   Diagnosis Date Noted  . Schizophrenia, paranoid ([place]) [F20.0] 09/09/2017  . Paranoid schizophrenia ([place]) [F20.0] 09/07/2017  . Cannabis use disorder, moderate,  dependence ([place]) [F12.20] 09/07/2017   Total Time spent with patient: 25 minutes  Past Psychiatric History: Schizophrenia, paranoid-type.  Past Medical History: History reviewed. No pertinent past medical history. History reviewed. No pertinent surgical history. Family History: History reviewed. No pertinent family history.  Family Psychiatric  History: See H&P  Social History:  History  Alcohol Use  . Yes    Comment: It varies     History  Drug Use  . Types: Marijuana    Social History   Social History  . Marital status: Single    Spouse name: N/A  . Number of children: N/A  . Years of education: N/A   Social History Main Topics  . Smoking status: Current Some Day Smoker    Packs/day: 1.00    Types: Cigarettes  . Smokeless tobacco: Never Used  . Alcohol use Yes     Comment: It varies  . Drug use: Yes    Types: Marijuana  . Sexual activity: Not Asked   Other Topics Concern  . None   Social History Narrative  . None   Additional Social History:  Pain Medications: None Prescriptions: None Over the Counter: None History of alcohol / drug use?: Yes Name of Substance 1: Marijuana 1 - Age of First Use: unknown 1 - Amount (size/oz): Unknown 1 - Frequency: Unknown 1 - Duration: Unknown 1 - Last Use / Amount: Pt cannot answer clearly.  THC in UDS.  Sleep: Good  Appetite:  Good  Current Medications: Current Facility-Administered Medications  Medication Dose Route Frequency Provider Last Rate  Last Dose  . acetaminophen (TYLENOL) tablet 650 mg  650 mg Oral Q6H PRN Ethelene Hal, NP      . alum & mag hydroxide-simeth (MAALOX/MYLANTA) 200-200-20 MG/5ML suspension 30 mL  30 mL Oral Q4H PRN Ethelene Hal, NP      . benztropine (COGENTIN) tablet 1 mg  1 mg Oral BID Ethelene Hal, NP   1 mg at 09/16/17 0755  . diphenhydrAMINE (BENADRYL) injection 50 mg  50 mg Intravenous Once PRN Ethelene Hal, NP      . feeding supplement (ENSURE  ENLIVE) (ENSURE ENLIVE) liquid 237 mL  237 mL Oral BID BM Hampton Abbot, MD   237 mL at 09/16/17 1451  . gabapentin (NEURONTIN) capsule 300 mg  300 mg Oral BID Ethelene Hal, NP   300 mg at 09/16/17 9024  . haloperidol (HALDOL) tablet 5 mg  5 mg Oral BID Derrill Center, NP   5 mg at 09/16/17 0973  . haloperidol decanoate (HALDOL DECANOATE) 100 MG/ML injection 200 mg  200 mg Intramuscular Q30 days Izediuno, Laruth Bouchard, MD   200 mg at 09/10/17 1502  . haloperidol lactate (HALDOL) injection 10 mg  10 mg Intramuscular Once PRN Ethelene Hal, NP      . hydrOXYzine (ATARAX/VISTARIL) tablet 25 mg  25 mg Oral BID Ethelene Hal, NP   25 mg at 09/16/17 0755  . LORazepam (ATIVAN) injection 2 mg  2 mg Intravenous Once PRN Ethelene Hal, NP      . magnesium hydroxide (MILK OF MAGNESIA) suspension 30 mL  30 mL Oral Daily PRN Ethelene Hal, NP      . ondansetron (ZOFRAN-ODT) disintegrating tablet 4 mg  4 mg Oral Q8H PRN Lindon Romp A, NP      . traZODone (DESYREL) tablet 50 mg  50 mg Oral QHS Ethelene Hal, NP       Lab Results:  No results found for this or any previous visit (from the past 78 hour(s)). Blood Alcohol level:  Lab Results  Component Value Date   ETH <10 53/29/9242   Metabolic Disorder Labs: Lab Results  Component Value Date   HGBA1C 5.1 09/10/2017   MPG 99.67 09/10/2017   Lab Results  Component Value Date   PROLACTIN 36.9 (H) 09/09/2017   Lab Results  Component Value Date   CHOL 153 09/10/2017   TRIG 97 09/10/2017   HDL 58 09/10/2017   CHOLHDL 2.6 09/10/2017   VLDL 19 09/10/2017   LDLCALC 76 09/10/2017   Physical Findings: AIMS: Facial and Oral Movements Muscles of Facial Expression: None, normal Lips and Perioral Area: None, normal Jaw: None, normal Tongue: None, normal,Extremity Movements Upper (arms, wrists, hands, fingers): None, normal Lower (legs, knees, ankles, toes): None, normal, Trunk Movements Neck, shoulders,  hips: None, normal, Overall Severity Severity of abnormal movements (highest score from questions above): None, normal Incapacitation due to abnormal movements: None, normal Patient's awareness of abnormal movements (rate only patient's report): No Awareness, Dental Status Current problems with teeth and/or dentures?: Yes Does patient usually wear dentures?: No  CIWA:  CIWA-Ar Total: 2 COWS:  COWS Total Score: 2  Musculoskeletal: Strength & Muscle Tone: within normal limits Gait & Station: normal Patient leans: N/A  Psychiatric Specialty Exam: Physical Exam  Nursing note and vitals reviewed. Constitutional: He appears well-developed.    Review of Systems  Constitutional: Negative.   Cardiovascular: Negative.   Gastrointestinal: Negative.   Skin: Negative.   Psychiatric/Behavioral: Depression: Denies  feeling depresed. Hallucinations: Hx. psychosis. Substance abuse: Cannabis use disorder. Insomnia: "Improving"     Blood pressure 115/78, pulse 78, temperature 98.1 F (36.7 C), temperature source Oral, resp. rate 18, height 5' 8.9" (1.75 m), weight 71.7 kg (158 lb), SpO2 98 %.Body mass index is 23.4 kg/m.  General Appearance: unkempt  Eye Contact:  normal  Speech: Clear and Coherent and Normal Rate,   Volume:  Normal  Mood:  " A bit better", euthymic  Affect: flat   Thought Process: coherent, some disorganized statements     Orientation:  Full (Time, Place, and Person)  Thought Content: linear   Suicidal Thoughts:  No  Homicidal Thoughts: Denied during this assessment   Memory:  Unable to assess at this time.   Judgement:  Poor  Insight:  Shallow  Psychomotor Activity:  Normal  Concentration:  Concentration: Fair and Attention Span: Fair  Recall:  Unable to assess at this time.   Fund of Knowledge:  Fair  Language:  Fair  Akathisia:  none noted  Handed:    AIMS (if indicated):     Assets:  Physical Health  ADL's:  Fair  Cognition:  WNL  Sleep:  Number of Hours: 3.75       Treatment Plan Summary: Daily contact with patient to assess and evaluate symptoms and progress in treatment.  Will continue today 09/16/17 plan as below except where it is noted.  -Continue Cogentin 1 mg bid for mood stabilization  -Continue Gabapentin 300 mg bid for agitation. -Continue Haldol decanoate 200 mg/ml IM Q 30 days 1st dose on 09-10-17 for mood control. -Continue Haldol 5 mg daily for mood control. -Hydroxyzine 25 mg prn for anxiety bid. -Trazodone 50 mg at bedtime for sleep.  - Continue 15 minutes observation for safety concerns - Encouraged to participate in milieu therapy and group therapy counseling sessions and also work with coping skills -  Develop treatment plan to decrease risk of relapse upon discharge and to reduce the need for readmission. -  Psycho-social education regarding relapse prevention and self care. - Health care follow up as needed for medical problems. - Restart home medications where appropriate.  Pennelope Bracken, MD 09/16/2017, 4:45 PM

## 2017-09-16 NOTE — Progress Notes (Signed)
Adult Psychoeducational Group Note  Date:  09/16/2017 Time:  9:14 PM  Group Topic/Focus:  Wrap-Up Group:   The focus of this group is to help patients review their daily goal of treatment and discuss progress on daily workbooks.  Participation Level:  Active  Participation Quality:  Appropriate  Affect:  Appropriate  Cognitive:  Alert  Insight: Appropriate  Engagement in Group:  Engaged  Modes of Intervention:  Discussion  Additional Comments: Patient stated having a pretty good day. Patient's goal for today was to get adjusted to his medication.  Stephen Harris L Christifer Chapdelaine 09/16/2017, 9:14 PM

## 2017-09-16 NOTE — BHH Group Notes (Signed)
LCSW Group Therapy 09/16/2017 1:15pm  Type of Therapy and Topic:  Group Therapy:  Change and Accountability  Participation Level:  None  Description of Group In this group, patients discussed power and accountability for change.  The group identified the challenges related to accountability and the difficulty of accepting the outcomes of negative behaviors.  Patients were encouraged to openly discuss a challenge/change they could take responsibility for.  Patients discussed the use of "change talk" and positive thinking as ways to support achievement of personal goals.  The group discussed ways to give support and empowerment to peers.  Therapeutic Goals: 1. Patients will state the relationship between personal power and accountability in the change process 2. Patients will identify the positive and negative consequences of a personal choice they have made 3. Patients will identify one challenge/choice they will take responsibility for making 4. Patients will discuss the role of "change talk" and the impact of positive thinking as it supports successful personal change 5. Patients will verbalize support and affirmation of change efforts in peers  Summary of Patient Progress:  Stephen Harris attended group but was pulled out of the room by the doctor.  He did not come back.  Therapeutic Modalities Solution Focused Brief Therapy Motivational Interviewing Cognitive Behavioral Therapy  Stephen Harris, Student-Social Work 09/16/2017 1:12 PM

## 2017-09-16 NOTE — Progress Notes (Signed)
Patient ID: Stephen Harris, male   DOB: 1980-12-23, 36 y.o.   MRN: [id number]  Pt has been sleeping since beginning of shift. respiration even and unlabored. Pt is safe on the unit.

## 2017-09-16 NOTE — Progress Notes (Signed)
Patient ID: Stephen ParisJames Harris, male   DOB: May 14, 1981, 36 y.o.   MRN: [id number]  D: Patient has been out of his room a lot today. Pt sat in dayroom to watch TV and paced the hallways. Pt reports he is concerned about discharge and is not sure where he is going. Pt denies SI/HI/AVH and pain.No behavioral issues noted.  A: Support and encouragement offered as needed to express needs. Pt encouraged to attend groups and engage in milieu.  R: Patient is safe and cooperative on unit. Will continue to monitor  for safety and stability.

## 2017-09-16 NOTE — Tx Team (Signed)
Interdisciplinary Treatment and Diagnostic Plan Update  09/16/2017 Time of Session: 11:06 AM  Stephen Harris MRN: [id number]  Principal Diagnosis: Schizophrenia  Secondary Diagnoses: Principal Problem:   Schizophrenia, paranoid ([place])   Current Medications:  Current Facility-Administered Medications  Medication Dose Route Frequency Provider Last Rate Last Dose  . acetaminophen (TYLENOL) tablet 650 mg  650 mg Oral Q6H PRN Ethelene Hal, NP      . alum & mag hydroxide-simeth (MAALOX/MYLANTA) 200-200-20 MG/5ML suspension 30 mL  30 mL Oral Q4H PRN Ethelene Hal, NP      . benztropine (COGENTIN) tablet 1 mg  1 mg Oral BID Ethelene Hal, NP   1 mg at 09/16/17 0755  . diphenhydrAMINE (BENADRYL) injection 50 mg  50 mg Intravenous Once PRN Ethelene Hal, NP      . feeding supplement (ENSURE ENLIVE) (ENSURE ENLIVE) liquid 237 mL  237 mL Oral BID BM Hampton Abbot, MD   237 mL at 09/15/17 0819  . gabapentin (NEURONTIN) capsule 300 mg  300 mg Oral BID Ethelene Hal, NP   300 mg at 09/16/17 0488  . haloperidol (HALDOL) tablet 5 mg  5 mg Oral BID Derrill Center, NP   5 mg at 09/16/17 8916  . haloperidol decanoate (HALDOL DECANOATE) 100 MG/ML injection 200 mg  200 mg Intramuscular Q30 days Izediuno, Laruth Bouchard, MD   200 mg at 09/10/17 1502  . haloperidol lactate (HALDOL) injection 10 mg  10 mg Intramuscular Once PRN Ethelene Hal, NP      . hydrOXYzine (ATARAX/VISTARIL) tablet 25 mg  25 mg Oral BID Ethelene Hal, NP   25 mg at 09/16/17 0755  . LORazepam (ATIVAN) injection 2 mg  2 mg Intravenous Once PRN Ethelene Hal, NP      . magnesium hydroxide (MILK OF MAGNESIA) suspension 30 mL  30 mL Oral Daily PRN Ethelene Hal, NP      . ondansetron (ZOFRAN-ODT) disintegrating tablet 4 mg  4 mg Oral Q8H PRN Lindon Romp A, NP      . traZODone (DESYREL) tablet 50 mg  50 mg Oral QHS Ethelene Hal, NP        PTA  Medications: Prescriptions Prior to Admission  Medication Sig Dispense Refill Last Dose  . aspirin 325 MG tablet Take 325 mg by mouth every 4 (four) hours as needed for mild pain.   Unknown at Unknown time    Patient Stressors: Financial difficulties Medication change or noncompliance Substance abuse  Patient Strengths: Capable of independent living Communication skills  Treatment Modalities: Medication Management, Group therapy, Case management,  1 to 1 session with clinician, Psychoeducation, Recreational therapy.   Physician Treatment Plan for Primary Diagnosis: Schizophrenia, paranoid ([place]) Long Term Goal(s): Improvement in symptoms so as ready for discharge  Short Term Goals: Ability to identify changes in lifestyle to reduce recurrence of condition will improve Ability to disclose and discuss suicidal ideas Ability to demonstrate self-control will improve Ability to identify and develop effective coping behaviors will improve Compliance with prescribed medications will improve Ability to identify triggers associated with substance abuse/mental health issues will improve  Medication Management: Evaluate patient's response, side effects, and tolerance of medication regimen.  Therapeutic Interventions: 1 to 1 sessions, Unit Group sessions and Medication administration.  Evaluation of Outcomes: Progressing   10/18:  Pt has unkempt grooming, and intense eye contact. He is guarded and pressured at times during the interview. He is irritable and paranoid but is showing improvement  overall from previous interaction. He is less perseverative on discharge planning. His insight is poor. -Continue Cogentin 1 mg bid for mood stabilization  -Continue Gabapentin 300 mg bid for agitation. -Continue Haldol decanoate 200 mg/ml IM Q 30 days 1st dose on 09-10-17 for mood control. -Continue Haldol 5 mg daily for mood control. -Hydroxyzine 25 mg prn for anxiety bid. -Trazodone 50 mg at bedtime  for sleep.  Physician Treatment Plan for Secondary Diagnosis: Principal Problem:   Schizophrenia, paranoid ([place])   Long Term Goal(s): Improvement in symptoms so as ready for discharge  Short Term Goals: Ability to identify changes in lifestyle to reduce recurrence of condition will improve Ability to disclose and discuss suicidal ideas Ability to demonstrate self-control will improve Ability to identify and develop effective coping behaviors will improve Compliance with prescribed medications will improve Ability to identify triggers associated with substance abuse/mental health issues will improve  Medication Management: Evaluate patient's response, side effects, and tolerance of medication regimen.  Therapeutic Interventions: 1 to 1 sessions, Unit Group sessions and Medication administration.  Evaluation of Outcomes: Progressing   RN Treatment Plan for Primary Diagnosis: Schizophrenia Long Term Goal(s): Knowledge of disease and therapeutic regimen to maintain health will improve  Short Term Goals: Ability to identify and develop effective coping behaviors will improve and Compliance with prescribed medications will improve  Medication Management: RN will administer medications as ordered by provider, will assess and evaluate patient's response and provide education to patient for prescribed medication. RN will report any adverse and/or side effects to prescribing provider.  Therapeutic Interventions: 1 on 1 counseling sessions, Psychoeducation, Medication administration, Evaluate responses to treatment, Monitor vital signs and CBGs as ordered, Perform/monitor CIWA, COWS, AIMS and Fall Risk screenings as ordered, Perform wound care treatments as ordered.  Evaluation of Outcomes: Progressing   LCSW Treatment Plan for Primary Diagnosis: Schizophrenia Long Term Goal(s): Safe transition to appropriate next level of care at discharge, Engage patient in therapeutic group addressing  interpersonal concerns.  Short Term Goals: Engage patient in aftercare planning with referrals and resources  Therapeutic Interventions: Assess for all discharge needs, 1 to 1 time with Social worker, Explore available resources and support systems, Assess for adequacy in community support network, Educate family and significant other(s) on suicide prevention, Complete Psychosocial Assessment, Interpersonal group therapy.  Evaluation of Outcomes: Met  Return home, follow up outpt-[facility] until sister comes to take him to [place] on 10/27   Progress in Treatment: Attending groups: Yes Participating in groups: Yes Taking medication as prescribed: Yes Toleration medication: Yes, no side effects reported at this time Family/Significant other contact made: Yes Patient understands diagnosis:No  Limited insight Discussing patient identified problems/goals with staff: Yes Medical problems stabilized or resolved: Yes Denies suicidal/homicidal ideation: Yes Issues/concerns per patient self-inventory: None Other: N/A  New problem(s) identified: None identified at this time.   New Short Term/Long Term Goal(s): "I need to get back to my projects at home.The pump is broke and I have to get water from my neighbor."   Discharge Plan or Barriers:   Reason for Continuation of Hospitalization: Disorganization Paranoia Medication stabilization   Estimated Length of Stay: Tuesday  Attendees: Patient:  09/16/2017  11:06 AM  Physician: Maris Berger, MD 09/16/2017  11:06 AM  Nursing: Sena Hitch, RN 09/16/2017  11:06 AM  RN Care Manager: Lars Pinks, RN 09/16/2017  11:06 AM  Social Worker: Ripley Fraise 09/16/2017  11:06 AM  Recreational Therapist: Winfield Cunas 09/16/2017  11:06 AM  Other: Norberto Sorenson 09/16/2017  11:06 AM  Other:  09/16/2017  11:06 AM    Scribe for Treatment Team:  Roque Lias LCSW 09/16/2017 11:06 AM

## 2017-09-17 MED ORDER — BISACODYL 5 MG PO TBEC
10.0000 mg | DELAYED_RELEASE_TABLET | Freq: Once | ORAL | Status: AC
  Administered 2017-09-17: 10 mg via ORAL
  Filled 2017-09-17 (×2): qty 2

## 2017-09-17 MED ORDER — PANTOPRAZOLE SODIUM 40 MG PO TBEC
40.0000 mg | DELAYED_RELEASE_TABLET | Freq: Every day | ORAL | Status: DC
  Administered 2017-09-17 – 2017-09-20 (×4): 40 mg via ORAL
  Filled 2017-09-17 (×6): qty 1

## 2017-09-17 NOTE — Progress Notes (Signed)
Patient ID: Stephen Harris, male   DOB: [birth year], 36 y.o.   MRN: [id number]   [facility] Progress Note  09/17/2017 3:30 PM Stephen Harris  MRN:  [id number]  Subjective: Stephen Harris reports, "I'm not okay. The medicines you all are giving me is eating me up, shutting me down. They are eating my throat. I have this nasty burps that stinks so bad. I feel tired, worn out. I feel drained. I feel like I need some weed, smoke me a blunt or two & I will feel better. My mind is not good. I got a lot of gas. I need my stuff, these medicines are not going to cut it for me"  Objective: Stephen Harris is seen, chart reviewed. Discussed his case with the treatment team. He is sitting down on the bench. He is complaining that medicines are making him feel tired, worn out, drained. He says he has gas problems, burps that stinks. He is craving weed, Although, seem to be clearing up mentally, he continue act as if responding to some internal stimuli. He had fair grooming, and normal eye contact. He mostly linear and goal-oriented but he also had some disorganized statements. He appeared less guarded and less pressured during the interview compared to previous interactions. He is less paranoid regarding delusions that he is being poisoned in the hospital & yet, thinks his medicines are eating him up. He is less perseverative on discharge planning. His insight is poor. He continues to crave weed.   Principal Problem: Schizophrenia, paranoid (HCC)  Diagnosis:   Patient Active Problem List   Diagnosis Date Noted  . Schizophrenia, paranoid (HCC) [F20.0] 09/09/2017  . Paranoid schizophrenia (HCC) [F20.0] 09/07/2017  . Cannabis use disorder, moderate, dependence (HCC) [F12.20] 09/07/2017   Total Time spent with patient: 15 minutes  Past Psychiatric History: Schizophrenia, paranoid-type.  Past Medical History: History reviewed. No pertinent past medical history. History reviewed. No pertinent surgical history. Family History: History  reviewed. No pertinent family history.  Family Psychiatric  History: See H&P  Social History:  History  Alcohol Use  . Yes    Comment: It varies     History  Drug Use  . Types: Marijuana    Social History   Social History  . Marital status: Single    Spouse name: N/A  . Number of children: N/A  . Years of education: N/A   Social History Main Topics  . Smoking status: Current Some Day Smoker    Packs/day: 1.00    Types: Cigarettes  . Smokeless tobacco: Never Used  . Alcohol use Yes     Comment: It varies  . Drug use: Yes    Types: Marijuana  . Sexual activity: Not Asked   Other Topics Concern  . None   Social History Narrative  . None   Additional Social History:  Pain Medications: None Prescriptions: None Over the Counter: None History of alcohol / drug use?: Yes Name of Substance 1: Marijuana 1 - Age of First Use: unknown 1 - Amount (size/oz): Unknown 1 - Frequency: Unknown 1 - Duration: Unknown 1 - Last Use / Amount: Pt cannot answer clearly.  THC in UDS.  Sleep: Good  Appetite:  Good  Current Medications: Current Facility-Administered Medications  Medication Dose Route Frequency Provider Last Rate Last Dose  . acetaminophen (TYLENOL) tablet 650 mg  650 mg Oral Q6H PRN Laveda Abbe, NP      . alum & mag hydroxide-simeth (MAALOX/MYLANTA) 200-200-20 MG/5ML suspension 30 mL  30  mL Oral Q4H PRN Laveda AbbeParks, Laurie Britton, NP      . benztropine (COGENTIN) tablet 1 mg  1 mg Oral BID Laveda AbbeParks, Laurie Britton, NP   1 mg at 09/17/17 0809  . bisacodyl (DULCOLAX) EC tablet 10 mg  10 mg Oral Once Armandina StammerNwoko, Jamal Haskin I, NP      . diphenhydrAMINE (BENADRYL) injection 50 mg  50 mg Intravenous Once PRN Laveda AbbeParks, Laurie Britton, NP      . feeding supplement (ENSURE ENLIVE) (ENSURE ENLIVE) liquid 237 mL  237 mL Oral BID BM Nelly RoutKumar, Archana, MD   237 mL at 09/17/17 1006  . gabapentin (NEURONTIN) capsule 300 mg  300 mg Oral BID Laveda AbbeParks, Laurie Britton, NP   300 mg at 09/17/17 [id number]   . haloperidol (HALDOL) tablet 5 mg  5 mg Oral BID Oneta RackLewis, Tanika N, NP   5 mg at 09/17/17 [id number]  . haloperidol decanoate (HALDOL DECANOATE) 100 MG/ML injection 200 mg  200 mg Intramuscular Q30 days Izediuno, Delight OvensVincent A, MD   200 mg at 09/10/17 1502  . haloperidol lactate (HALDOL) injection 10 mg  10 mg Intramuscular Once PRN Laveda AbbeParks, Laurie Britton, NP      . hydrOXYzine (ATARAX/VISTARIL) tablet 25 mg  25 mg Oral BID Laveda AbbeParks, Laurie Britton, NP   25 mg at 09/17/17 0809  . LORazepam (ATIVAN) injection 2 mg  2 mg Intravenous Once PRN Laveda AbbeParks, Laurie Britton, NP      . magnesium hydroxide (MILK OF MAGNESIA) suspension 30 mL  30 mL Oral Daily PRN Laveda AbbeParks, Laurie Britton, NP      . ondansetron (ZOFRAN-ODT) disintegrating tablet 4 mg  4 mg Oral Q8H PRN Nira ConnBerry, Jason A, NP   4 mg at 09/17/17 1527  . pantoprazole (PROTONIX) EC tablet 40 mg  40 mg Oral Daily Arshi Duarte I, NP      . traZODone (DESYREL) tablet 50 mg  50 mg Oral QHS Laveda AbbeParks, Laurie Britton, NP       Lab Results:  No results found for this or any previous visit (from the past 48 hour(s)). Blood Alcohol level:  Lab Results  Component Value Date   ETH <10 09/06/2017   Metabolic Disorder Labs: Lab Results  Component Value Date   HGBA1C 5.1 09/10/2017   MPG 99.67 09/10/2017   Lab Results  Component Value Date   PROLACTIN 36.9 (H) 09/09/2017   Lab Results  Component Value Date   CHOL 153 09/10/2017   TRIG 97 09/10/2017   HDL 58 09/10/2017   CHOLHDL 2.6 09/10/2017   VLDL 19 09/10/2017   LDLCALC 76 09/10/2017   Physical Findings: AIMS: Facial and Oral Movements Muscles of Facial Expression: None, normal Lips and Perioral Area: None, normal Jaw: None, normal Tongue: None, normal,Extremity Movements Upper (arms, wrists, hands, fingers): None, normal Lower (legs, knees, ankles, toes): None, normal, Trunk Movements Neck, shoulders, hips: None, normal, Overall Severity Severity of abnormal movements (highest score from questions above):  None, normal Incapacitation due to abnormal movements: None, normal Patient's awareness of abnormal movements (rate only patient's report): No Awareness, Dental Status Current problems with teeth and/or dentures?: Yes Does patient usually wear dentures?: No  CIWA:  CIWA-Ar Total: 2 COWS:  COWS Total Score: 2  Musculoskeletal: Strength & Muscle Tone: within normal limits Gait & Station: normal Patient leans: N/A  Psychiatric Specialty Exam: Physical Exam  Nursing note and vitals reviewed. Constitutional: He appears well-developed.    Review of Systems  Constitutional: Negative.   Cardiovascular: Negative.   Gastrointestinal: Negative.  Skin: Negative.   Psychiatric/Behavioral: Depression: Denies feeling depresed. Hallucinations: Hx. psychosis. Substance abuse: Cannabis use disorder. Insomnia: "Improving"     Blood pressure 107/69, pulse 76, temperature 98.3 F (36.8 C), resp. rate 16, height 5' 8.9" (1.75 m), weight 71.7 kg (158 lb), SpO2 98 %.Body mass index is 23.4 kg/m.  General Appearance: unkempt  Eye Contact:  normal  Speech: Clear and Coherent and Normal Rate,   Volume:  Normal  Mood:  " A bit better", irritate, cravings.  Affect: Distressed.  Thought Process: coherent, some disorganized statements     Orientation:  Full (Time, Place, and Person)  Thought Content: linear   Suicidal Thoughts:  No  Homicidal Thoughts: Denied during this assessment   Memory:  Unable to assess at this time.   Judgement:  Poor  Insight:  Shallow  Psychomotor Activity:  Normal  Concentration:  Concentration: Fair and Attention Span: Fair  Recall:  Unable to assess at this time.   Fund of Knowledge:  Fair  Language:  Fair  Akathisia:  none noted  Handed:    AIMS (if indicated):     Assets:  Physical Health  ADL's:  Fair  Cognition:  WNL  Sleep:  Number of Hours: 5.75     Treatment Plan Summary: Daily contact with patient to assess and evaluate symptoms and progress in  treatment.  Will continue today 09/17/17 plan as below except where it is noted.  -Continue Cogentin 1 mg bid for mood stabilization  -Continue Gabapentin 300 mg bid for agitation. -Continue Haldol decanoate 200 mg/ml IM Q 30 days 1st dose on 09-10-17 for mood control. -Continue Haldol 5 mg daily for mood control. -Hydroxyzine 25 mg prn for anxiety bid. -Trazodone 50 mg at bedtime for sleep. -Initiate Protonix 40 mg daily for GERD. -Dulcolax 10 mg once for constipation.  - Continue 15 minutes observation for safety concerns - Encouraged to participate in milieu therapy and group therapy counseling sessions and also work with coping skills -  Develop treatment plan to decrease risk of relapse upon discharge and to reduce the need for readmission. -  Psycho-social education regarding relapse prevention and self care. - Health care follow up as needed for medical problems. - Restart home medications where appropriate.  Sanjuana Kava, NP, PMHNP, FNP-BC 09/17/2017, 3:30 PM Patient ID: Stephen Harris, male   DOB: 03/15/1981, 13 y.o.   MRN: [id number]

## 2017-09-17 NOTE — [facility] Group Notes (Signed)
LCSW Group Therapy Note   09/17/2017 1:15pm   Type of Therapy and Topic:  Group Therapy:  Positive Affirmations   Participation Level:  Did Not Attend  Description of Group: This group addressed positive affirmation toward self and others. Patients went around the room and identified two positive things about themselves and two positive things about a peer in the room. Patients reflected on how it felt to share something positive with others, to identify positive things about themselves, and to hear positive things from others. Patients were encouraged to have a daily reflection of positive characteristics or circumstances.  Therapeutic Goals 1. Patient will verbalize two of their positive qualities 2. Patient will demonstrate empathy for others by stating two positive qualities about a peer in the group 3. Patient will verbalize their feelings when voicing positive self affirmations and when voicing positive affirmations of others 4. Patients will discuss the potential positive impact on their wellness/recovery of focusing on positive traits of self and others. Summary of Patient Progress:    Therapeutic Modalities Cognitive Behavioral Therapy Motivational Interviewing  Itzel Lowrimore M Zubin Pontillo, Student-Social Work 09/17/2017 2:43 PM  

## 2017-09-17 NOTE — Plan of Care (Signed)
Problem: Safety: Goal: Ability to remain free from injury will improve Outcome: Progressing Patient is safe and free from injury.   

## 2017-09-17 NOTE — Progress Notes (Signed)
DAR NOTE: Patient presents with anxious affect and depressed mood.  Denies pain, auditory and visual hallucinations.  Rates depression at 01, hopelessness at 01, and anxiety at 01.  Patient reports and complain of nausea and vomiting episode.  States medication making him too sick and tired.  Refused 5 pm Vistaril and Neurontin.  Zofran 4 mg given with good effect.  Maintained on routine safety checks. Support and encouragement offered as needed.  Patient was visible in the dayroom interacting with staff and peers.

## 2017-09-17 NOTE — Progress Notes (Signed)
Patient ID: Stephen ParisJames Welford, male   DOB: [birth year], 36 y.o.   MRN: [id number]  D: Patient has been in his room most of the evening. Pt has not had any episode of N/V this shift. Pt reports he is concerned about discharge and is not sure where he is going. Pt denies SI/HI/AVH and pain.No behavioral issues noted.  A: Support and encouragement offered as needed to express needs. Pt encouraged to attend groups and engage in milieu.  R: Patient is safe and cooperative on unit. Will continue to monitor  for safety and stability.

## 2017-09-17 NOTE — Progress Notes (Signed)
Recreation Therapy Notes  Date: 09/17/17 Time: 1000 Location: 500 Hall Dayroom  Group Topic: Leisure Education  Goal Area(s) Addresses:  Patient will identify positive leisure activities.  Patient will identify one positive benefit of participation in leisure activities.   Behavioral Response: Engaged  Intervention: Chairs, small beach ball  Activity: Keep It ContractorGoing Volleyball.  LRT seated patients in a circle.  Patients were to toss the ball back and fourth to each other without letting the ball come to a stop.  Patients could bounce the ball off the floor but the wall was to remain moving at all times.  LRT would count the number of hits on the ball.  If the ball stopped the count would start over.  Education:  Leisure Education, Building control surveyorDischarge Planning  Education Outcome: Acknowledges education/In group clarification offered/Needs additional education  Clinical Observations/Feedback: Pt was very active and social in group.  Pt interacted well with his peers.  Pt would still make off the wall comments.   Caroll RancherMarjette Orestes Geiman, LRT/CTRS         Caroll RancherLindsay, Labrenda Lasky A 09/17/2017 12:24 PM
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
## 2017-09-18 NOTE — Progress Notes (Signed)
DAR NOTE: Patient presents with flat affect and depressed mood.  Denies pain, auditory and visual hallucinations.  Described energy level as low and concentration as poor.  Rates depression at 01, hopelessness at 01, and anxiety at 01.  Maintained on routine safety checks.  Medications given as prescribed.  Support and encouragement offered as needed.  Attended group and participated.  States goal for today is "discharge."  Patient visible in the dayroom watching TV.  Minimal interaction with staff and peers.  Offered no complaint.

## 2017-09-18 NOTE — Progress Notes (Signed)
D.  Pt in bed on approach, minimal interaction.  Pt has remained in room in bed throughout shift.  Respirations even and unlabored, no distress noted.  A.  Support and encouragement offered  R  Pt remains safe on the unit, will continue to monitor.

## 2017-09-18 NOTE — [facility] Group Notes (Signed)
  [facility] LCSW Group Therapy Note  Date/Time:  09/18/2017 11:15AM-12:00PM  Type of Therapy and Topic:  Group Therapy:  Feelings About Hospitalization  Participation Level:  Active   Description of Group This process group involved patients discussing their feelings related to being hospitalized, as well as the benefits they see to being in the hospital.  These feelings and benefits were itemized.  The group then brainstormed specific ways in which they could seek those same benefits when they discharge and return home.  Therapeutic Goals 1. Patient will identify and describe positive and negative feelings related to hospitalization 2. Patient will verbalize benefits of hospitalization to themselves personally 3. Patients will brainstorm together ways they can obtain similar benefits in the outpatient setting, identify barriers to wellness and possible solutions  Summary of Patient Progress:  The patient expressed his primary feelings about being hospitalized are "I'm leaving in 4 days and just have to endure it until then."  His comments were circumstantial throughout the remainder of group.  Therapeutic Modalities Cognitive Behavioral Therapy Motivational Interviewing    Ambrose MantleMareida Grossman-Orr, LCSW 09/18/2017, 8:59 AM

## 2017-09-19 NOTE — Progress Notes (Signed)
Patient ID: Stephen Harris, male   DOB: [birth year], 36 y.o.   MRN: [id number]  Patient was agreeable to taking his scheduled prescribed medications this morning. However, when he reached for his scheduled Neurontin patient threw the capsule in the trash can. Writer inquired why patient threw the capsule away and he stated, "it makes me sick." Patient has refused to take his scheduled morning Neurontin.

## 2017-09-19 NOTE — Progress Notes (Signed)
Patient ID: Stephen Harris, male   DOB: [birth year], 36 y.o.   MRN: [id number]  DAR: Pt. Denies SI/HI and A/V Hallucinations. He reports that his sleep last night was good, his appetite is good, his energy level is normal, and concentration is good. He rates his depression level 1/10, hopelessness 1/10, and anxiety 1/10. Patient does not report any pain or discomfort at this time. Support and encouragement provided to the patient. Scheduled medications administered to patient per physician's orders except Gabapentin as he threw the pill in the trash. MD Arfeen was notified of this refusal. Patient is minimal in interaction with staff. He continues to present with some paranoia however this appears to be improving. He is seen more in the milieu as the day progresses. Q15 minute checks are maintained for safety.

## 2017-09-19 NOTE — Progress Notes (Addendum)
[facility] MD Progress Note  09/19/2017 12:05 PM Stephen Harris  MRN:  [id number] Subjective:  I stop taking gabapentin.  It is making my stomach upset. Objective; patient seen chart reviewed.  He is refusing to take gabapentin because he believe it is causing stomach upset.  Patient remains paranoid and delusional but cooperative and going to the groups.  His thought processes remains disorganized at times and he continued to get at times irritable.  He does not want to take gabapentin because he believed it is causing stomach upset.  Reassurance given but he refused to take it.  He lives by himself.  He is not disruptive in the unit.  Principal Problem: Schizophrenia, paranoid (HCC) Diagnosis:   Patient Active Problem List   Diagnosis Date Noted  . Schizophrenia, paranoid (HCC) [F20.0] 09/09/2017  . Paranoid schizophrenia (HCC) [F20.0] 09/07/2017  . Cannabis use disorder, moderate, dependence (HCC) [F12.20] 09/07/2017   Total Time spent with patient: 20 minutes  Past Psychiatric History: Reviewed.  Past Medical History: History reviewed. No pertinent past medical history. History reviewed. No pertinent surgical history. Family History: History reviewed. No pertinent family history. Family Psychiatric  History: Reviewed. Social History:  History  Alcohol Use  . Yes    Comment: It varies     History  Drug Use  . Types: Marijuana    Social History   Social History  . Marital status: Single    Spouse name: N/A  . Number of children: N/A  . Years of education: N/A   Social History Main Topics  . Smoking status: Current Some Day Smoker    Packs/day: 1.00    Types: Cigarettes  . Smokeless tobacco: Never Used  . Alcohol use Yes     Comment: It varies  . Drug use: Yes    Types: Marijuana  . Sexual activity: Not Asked   Other Topics Concern  . None   Social History Narrative  . None   Additional Social History:    Pain Medications: None Prescriptions: None Over the  Counter: None History of alcohol / drug use?: Yes Name of Substance 1: Marijuana 1 - Age of First Use: unknown 1 - Amount (size/oz): Unknown 1 - Frequency: Unknown 1 - Duration: Unknown 1 - Last Use / Amount: Pt cannot answer clearly.  THC in UDS.                  Sleep: Fair  Appetite:  Fair  Current Medications: Current Facility-Administered Medications  Medication Dose Route Frequency Provider Last Rate Last Dose  . acetaminophen (TYLENOL) tablet 650 mg  650 mg Oral Q6H PRN Laveda Abbe, NP      . alum & mag hydroxide-simeth (MAALOX/MYLANTA) 200-200-20 MG/5ML suspension 30 mL  30 mL Oral Q4H PRN Laveda Abbe, NP      . benztropine (COGENTIN) tablet 1 mg  1 mg Oral BID Laveda Abbe, NP   1 mg at 09/19/17 4098  . diphenhydrAMINE (BENADRYL) injection 50 mg  50 mg Intravenous Once PRN Laveda Abbe, NP      . feeding supplement (ENSURE ENLIVE) (ENSURE ENLIVE) liquid 237 mL  237 mL Oral BID BM Nelly Rout, MD   237 mL at 09/18/17 1305  . gabapentin (NEURONTIN) capsule 300 mg  300 mg Oral BID Laveda Abbe, NP   300 mg at 09/18/17 1619  . haloperidol (HALDOL) tablet 5 mg  5 mg Oral BID Oneta Rack, NP   5 mg at 09/19/17  [id number]  . haloperidol decanoate (HALDOL DECANOATE) 100 MG/ML injection 200 mg  200 mg Intramuscular Q30 days Izediuno, Vincent A, MD   200 mg at 09/10/17 1502  . haloperidol lactate (HALDOL) injection 10 mg  10 mg Intramuscular Once PRN Laveda AbbeParks, Laurie Britton, NP      . hydrOXYzine (ATARAX/VISTARIL) tablet 25 mg  25 mg Oral BID Laveda AbbeParks, Laurie Britton, NP   25 mg at 09/19/17 [id number]  . LORazepam (ATIVAN) injection 2 mg  2 mg Intravenous Once PRN Laveda AbbeParks, Laurie Britton, NP      . magnesium hydroxide (MILK OF MAGNESIA) suspension 30 mL  30 mL Oral Daily PRN Laveda AbbeParks, Laurie Britton, NP      . ondansetron (ZOFRAN-ODT) disintegrating tablet 4 mg  4 mg Oral Q8H PRN Nira ConnBerry, Jason A, NP   4 mg at 09/17/17 1527  . pantoprazole (PROTONIX)  EC tablet 40 mg  40 mg Oral Daily Armandina StammerNwoko, Agnes I, NP   40 mg at 09/18/17 1620  . traZODone (DESYREL) tablet 50 mg  50 mg Oral QHS Laveda AbbeParks, Laurie Britton, NP        Lab Results: No results found for this or any previous visit (from the past 48 hour(s)).  Blood Alcohol level:  Lab Results  Component Value Date   ETH <10 09/06/2017    Metabolic Disorder Labs: Lab Results  Component Value Date   HGBA1C 5.1 09/10/2017   MPG 99.67 09/10/2017   Lab Results  Component Value Date   PROLACTIN 36.9 (H) 09/09/2017   Lab Results  Component Value Date   CHOL 153 09/10/2017   TRIG 97 09/10/2017   HDL 58 09/10/2017   CHOLHDL 2.6 09/10/2017   VLDL 19 09/10/2017   LDLCALC 76 09/10/2017    Physical Findings: AIMS: Facial and Oral Movements Muscles of Facial Expression: None, normal Lips and Perioral Area: None, normal Jaw: None, normal Tongue: None, normal,Extremity Movements Upper (arms, wrists, hands, fingers): None, normal Lower (legs, knees, ankles, toes): None, normal, Trunk Movements Neck, shoulders, hips: None, normal, Overall Severity Severity of abnormal movements (highest score from questions above): None, normal Incapacitation due to abnormal movements: None, normal Patient's awareness of abnormal movements (rate only patient's report): No Awareness, Dental Status Current problems with teeth and/or dentures?: Yes Does patient usually wear dentures?: No  CIWA:  CIWA-Ar Total: 2 COWS:  COWS Total Score: 2  Musculoskeletal: Strength & Muscle Tone: within normal limits Gait & Station: normal Patient leans: N/A  Psychiatric Specialty Exam: Physical Exam  Review of Systems  Constitutional: Negative.   HENT: Negative.   Respiratory: Negative.   Cardiovascular: Negative.   Gastrointestinal: Positive for heartburn.  Genitourinary: Negative.   Musculoskeletal: Negative.   Skin: Negative.   Neurological: Negative.     Blood pressure 118/77, pulse 89, temperature (!)  97.3 F (36.3 C), temperature source Oral, resp. rate 16, height 5' 8.9" (1.75 m), weight 71.7 kg (158 lb), SpO2 98 %.Body mass index is 23.4 kg/m.  General Appearance: Fairly Groomed and Guarded  Eye Contact:  Fair  Speech:  Slow  Volume:  Normal  Mood:  Irritable  Affect:  Labile  Thought Process:  Descriptions of Associations: Circumstantial  Orientation:  Full (Time, Place, and Person)  Thought Content:  Paranoid Ideation  Suicidal Thoughts:  No  Homicidal Thoughts:  No  Memory:  Immediate;   Fair Recent;   Fair Remote;   Fair  Judgement:  Fair  Insight:  Fair  Psychomotor Activity:  Normal  Concentration:  Concentration:  Fair and Attention Span: Fair  Recall:  [employer] of Knowledge:  Good  Language:  Good  Akathisia:  No  Handed:  Right  AIMS (if indicated):     Assets:  Communication Skills Desire for Improvement Housing  ADL's:  Intact  Cognition:  WNL  Sleep:  Number of Hours: 5.75     Treatment Plan Summary: Daily contact with patient to assess and evaluate symptoms and progress in treatment Patient refusing Neurontin.  We will discontinue. Continue Haldol 5 mg Twice a day, Cogentin 1 mg twice a day and trazodone 50 mg at bedtime.  Patient is slowly and gradually improving but he continued to have symptoms of paranoia and delusions.  Encouraged to participate in group therapy.  Blood work results reviewed.  His hemoglobin A1c, lipid panel, TSH is normal.  Prolactin level XXXVI.9.  Social worker to start discharge planning. Wray Goehring T., MD 09/19/2017, 12:05 PM

## 2017-09-19 NOTE — Progress Notes (Signed)
Psychoeducational Group Note  Date:  09/19/2017 Time:  2137  Group Topic/Focus:  Wrap-Up Group:   The focus of this group is to help patients review their daily goal of treatment and discuss progress on daily workbooks.  Participation Level: Did Not Attend  Participation Quality:  Not Applicable  Affect:  Not Applicable  Cognitive:  Not Applicable  Insight:  Not Applicable  Engagement in Group: Not Applicable  Additional Comments:  The patient did not attend group this evening.   Hazle CocaGOODMAN, Pama Roskos S 09/19/2017, 9:37 PM

## 2017-09-19 NOTE — [facility] Group Notes (Signed)
[facility]  Date/Time:  09/19/2017  11:00AM-12:00PM  Type of Therapy and Topic:  Group Therapy:  Music and Mood  Participation Level:  Active   Description of Group: In this process group, members listened to a variety of genres of music and identified that different types of music evoke different responses.  Patients were encouraged to identify music that was soothing for them and music that was energizing for them.  Patients discussed how this knowledge can help with wellness and recovery in various ways including managing depression and anxiety as well as encouraging healthy sleep habits.    Therapeutic Goals: 1. Patients will explore the impact of different varieties of music on mood 2. Patients will verbalize the thoughts they have when listening to different types of music 3. Patients will identify music that is soothing to them as well as music that is energizing to them 4. Patients will discuss how to use this knowledge to assist in maintaining wellness and recovery 5. Patients will explore the use of music as a coping skill  Summary of Patient Progress:  At the beginning of group, patient expressed feeling energetic and at the end of group his response was too disorganized to understand.  His responses were consistently disorganized and off topic, I.e. A song made him feel "like a Natasha MeadSlim Jim."   Therapeutic Modalities: Solution Focused Brief Therapy Motivational Interviewing Activity   Ambrose MantleMareida Grossman-Orr, LCSW 09/19/2017 9:21 AM

## 2017-09-20 NOTE — [facility] Group Notes (Signed)
LCSW Group Therapy Note   09/20/2017 1:15pm   Type of Therapy and Topic:  Group Therapy:  Overcoming Obstacles   Participation Level:  Active   Description of Group:    In this group patients will be encouraged to explore what they see as obstacles to their own wellness and recovery. They will be guided to discuss their thoughts, feelings, and behaviors related to these obstacles. The group will process together ways to cope with barriers, with attention given to specific choices patients can make. Each patient will be challenged to identify changes they are motivated to make in order to overcome their obstacles. This group will be process-oriented, with patients participating in exploration of their own experiences as well as giving and receiving support and challenge from other group members.   Therapeutic Goals: 1. Patient will identify personal and current obstacles as they relate to admission. 2. Patient will identify barriers that currently interfere with their wellness or overcoming obstacles.  3. Patient will identify feelings, thought process and behaviors related to these barriers. 4. Patient will identify two changes they are willing to make to overcome these obstacles:      Summary of Patient Progress   Stayed the entire time, engaged throughout. "I need to continue on medication when I leave here.  They help me with indigestion, help me cope, help me sleep and keep me calm.'  Patient acknowlged several times that he is learning to be around others, and even is learning to enjoy it.  "I was never like this before. I wanted to stay away from other people."  Several peers pointed out how he has been spending an increasing amount of time in the day room.   Therapeutic Modalities:   Cognitive Behavioral Therapy Solution Focused Therapy Motivational Interviewing Relapse Prevention Therapy  Ida RogueRodney B Jaquarius Seder, LCSW 09/20/2017 4:32 PM

## 2017-09-20 NOTE — Progress Notes (Signed)
Patient ID: Stephen Harris, male   DOB: [birth year], 36 y.o.   MRN: [id number]  DAR: Pt. Denies SI/HI and A/V Hallucinations. He reports that his sleep last night was fair, his appetite is good, his energy level is normal, and his concentration is good. He rates his depression, hopelessness, and anxiety 1/10 today. He reports that he is feeling better since Gabapentin was discontinued yesterday. Patient reports some tooth pain on the upper left side of his mouth this morning however refused intervention at this time. Support and encouragement provided to the patient. Scheduled medications administered to patient per physician's orders. Patient continues to be minimal with staff but cooperative. He is seen in the milieu throughout the day and is reporting that he is looking forward to discharge soon. Q15 minute checks are maintained for safety.

## 2017-09-20 NOTE — Progress Notes (Signed)
Recreation Therapy Notes  Date: 09/20/17 Time: 1000 Location: [address] Dayroom  Group Topic: Coping Skills  Goal Area(s) Addresses:  Patients will be able to identify positive coping skills. Patients will be able to identify the benefits of using coping skills post d/c.  Behavioral Response: Engaged  Intervention: Production designer, theatre/television/filmWeb design worksheet, pencils  Activity: OrthoptistWeb Design.  Patients were given a picture of a blank spider web.  Patients were to identify the things that have gotten them stuck and write them inside the web.  Patients were to then identify at least two coping skills for each situation they identified.  Education: PharmacologistCoping Skills, Building control surveyorDischarge Planning.   Education Outcome: Acknowledges understanding/In group clarification offered/Needs additional education.   Clinical Observations/Feedback: Pt identified his struggles as finances, food and sight.  Pt stated his coping skills were to participate, expressing feelings and taking stock of his body and what's going on with him.  Pt expressed using coping skills would "keep that web from trapping you and circling around you so you can't burst out".   Caroll RancherMarjette Jetson Pickrel, LRT/CTRS         Caroll RancherLindsay, Stephen Harris A 09/20/2017 12:09 PM

## 2017-09-20 NOTE — Progress Notes (Signed)
Patient ID: Stephen Harris, male   DOB: [birth year], 36 y.o.   MRN: [id number]  [facility] MD Progress Note  09/20/2017 3:32 PM Danyel Griess  MRN:  [id number] Subjective: Pt reports that today he is doing "A hell of a lot better," and when asked to provide further details, pt describes, "I'm not puking my guts out, and think the meds are ok."   Objective; patient seen chart reviewed.  Today he is pleasant and cooperative. He had some GI upset over the weekend which he associated with use of gabapentin, which was discontinued. Pt has some ongoing paranoia and disorganization, but he has shown significant improvement. He is attending groups and is cooperative with the treatment plan. He is future oriented [place], but he is also open to the option of staying with his sister as the family is planning to come visit him from [place]. Pt reports that he is tolerating his medications without difficulty. He denies SI/HI/AH/VH. His sleep has been good and his appetite has been adequate. He had no further questions, comments, or concerns.   Principal Problem: Schizophrenia, paranoid (HCC) Diagnosis:   Patient Active Problem List   Diagnosis Date Noted  . Schizophrenia, paranoid (HCC) [F20.0] 09/09/2017  . Paranoid schizophrenia (HCC) [F20.0] 09/07/2017  . Cannabis use disorder, moderate, dependence (HCC) [F12.20] 09/07/2017   Total Time spent with patient: 30 minutes  Past Psychiatric History: Reviewed.  Past Medical History: History reviewed. No pertinent past medical history. History reviewed. No pertinent surgical history. Family History: History reviewed. No pertinent family history. Family Psychiatric  History: Reviewed. Social History:  History  Alcohol Use  . Yes    Comment: It varies     History  Drug Use  . Types: Marijuana    Social History   Social History  . Marital status: Single    Spouse name: N/A  . Number  of children: N/A  . Years of education: N/A   Social History Main Topics  . Smoking status: Current Some Day Smoker    Packs/day: 1.00    Types: Cigarettes  . Smokeless tobacco: Never Used  . Alcohol use Yes     Comment: It varies  . Drug use: Yes    Types: Marijuana  . Sexual activity: Not Asked   Other Topics Concern  . None   Social History Narrative  . None   Additional Social History:    Pain Medications: None Prescriptions: None Over the Counter: None History of alcohol / drug use?: Yes Name of Substance 1: Marijuana 1 - Age of First Use: unknown 1 - Amount (size/oz): Unknown 1 - Frequency: Unknown 1 - Duration: Unknown 1 - Last Use / Amount: Pt cannot answer clearly.  THC in UDS.                  Sleep: Good  Appetite:  Good  Current Medications: Current Facility-Administered Medications  Medication Dose Route Frequency Provider Last Rate Last Dose  . acetaminophen (TYLENOL) tablet 650 mg  650 mg Oral Q6H PRN Laveda Abbe, NP      . alum & mag hydroxide-simeth (MAALOX/MYLANTA) 200-200-20 MG/5ML suspension 30 mL  30 mL Oral Q4H PRN Laveda Abbe, NP      . benztropine (COGENTIN) tablet 1 mg  1 mg Oral BID Laveda Abbe, NP   1 mg at 09/20/17 0729  . diphenhydrAMINE (BENADRYL) injection 50 mg  50 mg Intravenous Once  PRN Laveda Abbe, NP      . feeding supplement (ENSURE ENLIVE) (ENSURE ENLIVE) liquid 237 mL  237 mL Oral BID BM Nelly Rout, MD   237 mL at 09/18/17 1305  . haloperidol (HALDOL) tablet 5 mg  5 mg Oral BID Oneta Rack, NP   5 mg at 09/20/17 8657  . haloperidol decanoate (HALDOL DECANOATE) 100 MG/ML injection 200 mg  200 mg Intramuscular Q30 days Izediuno, Delight Ovens, MD   200 mg at 09/10/17 1502  . haloperidol lactate (HALDOL) injection 10 mg  10 mg Intramuscular Once PRN Laveda Abbe, NP      . hydrOXYzine (ATARAX/VISTARIL) tablet 25 mg  25 mg Oral BID Laveda Abbe, NP   25 mg at  09/20/17 0729  . LORazepam (ATIVAN) injection 2 mg  2 mg Intravenous Once PRN Laveda Abbe, NP      . magnesium hydroxide (MILK OF MAGNESIA) suspension 30 mL  30 mL Oral Daily PRN Laveda Abbe, NP      . ondansetron (ZOFRAN-ODT) disintegrating tablet 4 mg  4 mg Oral Q8H PRN Nira Conn A, NP   4 mg at 09/17/17 1527  . pantoprazole (PROTONIX) EC tablet 40 mg  40 mg Oral Daily Armandina Stammer I, NP   40 mg at 09/19/17 1724  . traZODone (DESYREL) tablet 50 mg  50 mg Oral QHS Laveda Abbe, NP        Lab Results: No results found for this or any previous visit (from the past 48 hour(s)).  Blood Alcohol level:  Lab Results  Component Value Date   ETH <10 09/06/2017    Metabolic Disorder Labs: Lab Results  Component Value Date   HGBA1C 5.1 09/10/2017   MPG 99.67 09/10/2017   Lab Results  Component Value Date   PROLACTIN 36.9 (H) 09/09/2017   Lab Results  Component Value Date   CHOL 153 09/10/2017   TRIG 97 09/10/2017   HDL 58 09/10/2017   CHOLHDL 2.6 09/10/2017   VLDL 19 09/10/2017   LDLCALC 76 09/10/2017    Physical Findings: AIMS: Facial and Oral Movements Muscles of Facial Expression: None, normal Lips and Perioral Area: None, normal Jaw: None, normal Tongue: None, normal,Extremity Movements Upper (arms, wrists, hands, fingers): None, normal Lower (legs, knees, ankles, toes): None, normal, Trunk Movements Neck, shoulders, hips: None, normal, Overall Severity Severity of abnormal movements (highest score from questions above): None, normal Incapacitation due to abnormal movements: None, normal Patient's awareness of abnormal movements (rate only patient's report): No Awareness, Dental Status Current problems with teeth and/or dentures?: Yes Does patient usually wear dentures?: No  CIWA:  CIWA-Ar Total: 2 COWS:  COWS Total Score: 2  Musculoskeletal: Strength & Muscle Tone: within normal limits Gait & Station: normal Patient leans:  N/A  Psychiatric Specialty Exam: Physical Exam  Review of Systems  Constitutional: Negative for chills and fever.  HENT: Negative.   Respiratory: Negative.  Negative for cough and shortness of breath.   Cardiovascular: Negative.  Negative for chest pain.  Gastrointestinal: Negative for abdominal pain, constipation, diarrhea, heartburn, nausea and vomiting.  Genitourinary: Negative.   Musculoskeletal: Negative.   Neurological: Negative.     Blood pressure 120/75, pulse 60, temperature 97.8 F (36.6 C), temperature source Oral, resp. rate 16, height 5' 8.9" (1.75 m), weight 71.7 kg (158 lb), SpO2 98 %.Body mass index is 23.4 kg/m.  General Appearance: Casual  Eye Contact:  Fair  Speech:  Clear and Coherent and Normal  Rate  Volume:  Normal  Mood:  Euthymic  Affect:  Congruent, Constricted and Flat  Thought Process:  Descriptions of Associations: Circumstantial  Orientation:  Full (Time, Place, and Person)  Thought Content:  Paranoid Ideation, denies SI/HI/AH/VH  Suicidal Thoughts:  No  Homicidal Thoughts:  No  Memory:  Immediate;   Fair Recent;   Fair Remote;   Fair  Judgement:  Fair  Insight:  Fair  Psychomotor Activity:  Normal  Concentration:  Concentration: Fair and Attention Span: Fair  Recall:  Fair  Fund of Knowledge:  Good  Language:  Good  Akathisia:  No  Handed:  Right  AIMS (if indicated):     Assets:  Communication Skills Desire for Improvement Housing  ADL's:  Intact  Cognition:  WNL  Sleep:  Number of Hours: 5.75     Treatment Plan Summary: Daily contact with patient to assess and evaluate symptoms and progress in treatment  Patient is slowly and gradually improving but he continued to have symptoms of paranoia and delusions.    - Continue Haldol 5mg  BID - Continue Cogentin 1 mg BID - Continue trazodone 50 mg qhs.  - Encouraged to participate in group therapy.   - Social worker to start discharge planning (anticipate DC on 09/21/17) Micheal Likenshristopher T  Blaise Palladino, MD 09/20/2017, 3:32 PM

## 2017-09-20 NOTE — Tx Team (Signed)
Interdisciplinary Treatment and Diagnostic Plan Update  09/20/2017 Time of Session: 4:36 PM  Stephen Harris MRN: [id number]  Principal Diagnosis: Schizophrenia  Secondary Diagnoses: Principal Problem:   Schizophrenia, paranoid ([place])   Current Medications:  Current Facility-Administered Medications  Medication Dose Route Frequency Provider Last Rate Last Dose  . acetaminophen (TYLENOL) tablet 650 mg  650 mg Oral Q6H PRN Ethelene Hal, NP      . alum & mag hydroxide-simeth (MAALOX/MYLANTA) 200-200-20 MG/5ML suspension 30 mL  30 mL Oral Q4H PRN Ethelene Hal, NP      . benztropine (COGENTIN) tablet 1 mg  1 mg Oral BID Ethelene Hal, NP   1 mg at 09/20/17 1630  . diphenhydrAMINE (BENADRYL) injection 50 mg  50 mg Intravenous Once PRN Ethelene Hal, NP      . feeding supplement (ENSURE ENLIVE) (ENSURE ENLIVE) liquid 237 mL  237 mL Oral BID BM Hampton Abbot, MD   237 mL at 09/18/17 1305  . haloperidol (HALDOL) tablet 5 mg  5 mg Oral BID Derrill Center, NP   5 mg at 09/20/17 1630  . haloperidol decanoate (HALDOL DECANOATE) 100 MG/ML injection 200 mg  200 mg Intramuscular Q30 days Izediuno, Laruth Bouchard, MD   200 mg at 09/10/17 1502  . haloperidol lactate (HALDOL) injection 10 mg  10 mg Intramuscular Once PRN Ethelene Hal, NP      . hydrOXYzine (ATARAX/VISTARIL) tablet 25 mg  25 mg Oral BID Ethelene Hal, NP   25 mg at 09/20/17 1630  . LORazepam (ATIVAN) injection 2 mg  2 mg Intravenous Once PRN Ethelene Hal, NP      . magnesium hydroxide (MILK OF MAGNESIA) suspension 30 mL  30 mL Oral Daily PRN Ethelene Hal, NP      . ondansetron (ZOFRAN-ODT) disintegrating tablet 4 mg  4 mg Oral Q8H PRN Lindon Romp A, NP   4 mg at 09/17/17 1527  . pantoprazole (PROTONIX) EC tablet 40 mg  40 mg Oral Daily Lindell Spar I, NP   40 mg at 09/20/17 1630  . traZODone (DESYREL) tablet 50 mg  50 mg Oral QHS Ethelene Hal, NP        PTA  Medications: Prescriptions Prior to Admission  Medication Sig Dispense Refill Last Dose  . aspirin 325 MG tablet Take 325 mg by mouth every 4 (four) hours as needed for mild pain.   Unknown at Unknown time    Patient Stressors: Financial difficulties Medication change or noncompliance Substance abuse  Patient Strengths: Capable of independent living Communication skills  Treatment Modalities: Medication Management, Group therapy, Case management,  1 to 1 session with clinician, Psychoeducation, Recreational therapy.   Physician Treatment Plan for Primary Diagnosis: Schizophrenia, paranoid ([place]) Long Term Goal(s): Improvement in symptoms so as ready for discharge  Short Term Goals: Ability to identify changes in lifestyle to reduce recurrence of condition will improve Ability to disclose and discuss suicidal ideas Ability to demonstrate self-control will improve Ability to identify and develop effective coping behaviors will improve Compliance with prescribed medications will improve Ability to identify triggers associated with substance abuse/mental health issues will improve  Medication Management: Evaluate patient's response, side effects, and tolerance of medication regimen.  Therapeutic Interventions: 1 to 1 sessions, Unit Group sessions and Medication administration.  Evaluation of Outcomes: Adequate for Discharge   10/18:  Pt has unkempt grooming, and intense eye contact. He is guarded and pressured at times during the interview. He is irritable  and paranoid but is showing improvement overall from previous interaction. He is less perseverative on discharge planning. His insight is poor. -Continue Cogentin 1 mg bid for mood stabilization  -Continue Gabapentin 300 mg bid for agitation. -Continue Haldol decanoate 200 mg/ml IM Q 30 days 1st dose on 09-10-17 for mood control. -Continue Haldol 5 mg daily for mood control. -Hydroxyzine 25 mg prn for anxiety bid. -Trazodone 50 mg  at bedtime for sleep.  Physician Treatment Plan for Secondary Diagnosis: Principal Problem:   Schizophrenia, paranoid ([place])   Long Term Goal(s): Improvement in symptoms so as ready for discharge  Short Term Goals: Ability to identify changes in lifestyle to reduce recurrence of condition will improve Ability to disclose and discuss suicidal ideas Ability to demonstrate self-control will improve Ability to identify and develop effective coping behaviors will improve Compliance with prescribed medications will improve Ability to identify triggers associated with substance abuse/mental health issues will improve  Medication Management: Evaluate patient's response, side effects, and tolerance of medication regimen.  Therapeutic Interventions: 1 to 1 sessions, Unit Group sessions and Medication administration.  Evaluation of Outcomes: Adequate for Discharge   RN Treatment Plan for Primary Diagnosis: Schizophrenia Long Term Goal(s): Knowledge of disease and therapeutic regimen to maintain health will improve  Short Term Goals: Ability to identify and develop effective coping behaviors will improve and Compliance with prescribed medications will improve  Medication Management: RN will administer medications as ordered by provider, will assess and evaluate patient's response and provide education to patient for prescribed medication. RN will report any adverse and/or side effects to prescribing provider.  Therapeutic Interventions: 1 on 1 counseling sessions, Psychoeducation, Medication administration, Evaluate responses to treatment, Monitor vital signs and CBGs as ordered, Perform/monitor CIWA, COWS, AIMS and Fall Risk screenings as ordered, Perform wound care treatments as ordered.  Evaluation of Outcomes: Adequate for Discharge   LCSW Treatment Plan for Primary Diagnosis: Schizophrenia Long Term Goal(s): Safe transition to appropriate next level of care at discharge, Engage patient in  therapeutic group addressing interpersonal concerns.  Short Term Goals: Engage patient in aftercare planning with referrals and resources  Therapeutic Interventions: Assess for all discharge needs, 1 to 1 time with Social worker, Explore available resources and support systems, Assess for adequacy in community support network, Educate family and significant other(s) on suicide prevention, Complete Psychosocial Assessment, Interpersonal group therapy.  Evaluation of Outcomes: Met  Return home, follow up outpt-[facility] until sister comes to take him to [place] on 10/27   Progress in Treatment: Attending groups: Yes Participating in groups: Yes Taking medication as prescribed: Yes Toleration medication: Yes, no side effects reported at this time Family/Significant other contact made: Yes Patient understands diagnosis:No  Limited insight Discussing patient identified problems/goals with staff: Yes Medical problems stabilized or resolved: Yes Denies suicidal/homicidal ideation: Yes Issues/concerns per patient self-inventory: None Other: N/A  New problem(s) identified: None identified at this time.   New Short Term/Long Term Goal(s): "I need to get back to my projects at home.The pump is broke and I have to get water from my neighbor."   Discharge Plan or Barriers:   Reason for Continuation of Hospitalization:  Medication stabilization   Estimated Length of Stay: [place] d/c tomorrow  Attendees: Patient:  09/20/2017  4:36 PM  Physician: Maris Berger, MD 09/20/2017  4:36 PM  Nursing: Sena Hitch, RN 09/20/2017  4:36 PM  RN Care Manager: Lars Pinks, RN 09/20/2017  4:36 PM  Social Worker: Ripley Fraise 09/20/2017  4:36 PM  Recreational Therapist:  Winfield Cunas 09/20/2017  4:36 PM  Other: Norberto Sorenson 09/20/2017  4:36 PM  Other:  09/20/2017  4:36 PM    Scribe for Treatment Team:  Roque Lias LCSW 09/20/2017 4:36 PM

## 2017-09-20 NOTE — Progress Notes (Signed)
Did not attend group 

## 2017-09-21 MED ORDER — HALOPERIDOL DECANOATE 100 MG/ML IM SOLN: 200.0000 mg | INTRAMUSCULAR | 0 refills | Status: AC

## 2017-09-21 MED ORDER — HYDROXYZINE HCL 25 MG PO TABS: 25.0000 mg | ORAL_TABLET | Freq: Two times a day (BID) | ORAL | 0 refills | Status: AC

## 2017-09-21 MED ORDER — TRAZODONE HCL 50 MG PO TABS: 50.0000 mg | ORAL_TABLET | Freq: Every day | ORAL | 0 refills | Status: AC

## 2017-09-21 MED ORDER — BENZTROPINE MESYLATE 1 MG PO TABS: 1.0000 mg | ORAL_TABLET | Freq: Two times a day (BID) | ORAL | 0 refills | Status: AC

## 2017-09-21 MED ORDER — HALOPERIDOL 5 MG PO TABS: 5.0000 mg | ORAL_TABLET | Freq: Two times a day (BID) | ORAL | 0 refills | Status: AC

## 2017-09-21 MED ORDER — PANTOPRAZOLE SODIUM 40 MG PO TBEC: 40.0000 mg | DELAYED_RELEASE_TABLET | Freq: Every day | ORAL | 0 refills | Status: AC

## 2017-09-21 NOTE — Progress Notes (Signed)
  [facility] :  Will you be returning to the same living situation after discharge:  Yes,  home At discharge, do you have transportation home?: Yes,  family Do you have the ability to pay for your medications: Yes,  mental health  Release of information consent forms completed and in the chart;  Patient's signature needed at discharge.  Patient to Follow up at: Follow-up Information    [facility] Follow up on 09/23/2017.   Specialty:  Behavioral Health Why:  Thursday at [facility] for your hospital follow up appointment. Bring along hospital d/c paperwork, and be prepared to stay a couple of hours. Contact information: [address] [place] [address] [phone]        [facility] Follow up.   Why:  This is a walk-in clinic that can help you get your medications until you are set up with the insurance carrier [facility]. Walk-in hours are every day, including the weekend, from 7:00am-5:30am. Please walk-in as soon as you can after [facility] Contact information: [address] [place] [id number] [facility] [address]) [phone]           Next level of care provider has access to [facility] Link:no  Safety Planning and Suicide Prevention discussed: Yes,  yes  Have you used any form of tobacco in the last 30 days? (Cigarettes, Smokeless Tobacco, Cigars, and/or Pipes): Yes  Has patient been referred to the Quitline?: Patient refused referral  Patient has been referred for addiction treatment:Patient refused referral  Ida RogueRodney B Deshanae Lindo, LCSW 09/21/2017, 10:39 AM

## 2017-09-21 NOTE — Progress Notes (Signed)
Recreation Therapy Notes  INPATIENT RECREATION TR PLAN  Patient Details Name: Stephen Harris MRN: [id number] DOB: 01-Mar-1981 Today's Date: 09/21/2017  Rec Therapy Plan Is patient appropriate for Therapeutic Recreation?: Yes Treatment times per week: about 3 days Estimated Length of Stay: 5-7 days TR Treatment/Interventions: Group participation (Comment)  Discharge Criteria Pt will be discharged from therapy if:: Discharged Treatment plan/goals/alternatives discussed and agreed upon by:: Patient/family  Discharge Summary Short term goals set: Patient will attend and participate in Recreation Therapy Group Sessions  Short term goals met: Complete Progress toward goals comments: Groups attended Which groups?: Coping skills, Wellness, Leisure education, Goal setting, Communication Reason goals not met: None Therapeutic equipment acquired: N/A Reason patient discharged from therapy: Discharge from hospital Pt/family agrees with progress & goals achieved: Yes Date patient discharged from therapy: 09/21/17   Victorino Sparrow, LRT/CTRS  Ria Comment, [place] 09/21/2017, 12:39 PM

## 2017-09-21 NOTE — Progress Notes (Signed)
Patient ID: Stephen Harris, male   DOB: 11/13/1981, 36 y.o.   MRN: [id number]  D: Patient has been in his room all evening. Mood and affect appears depressed and flat. Pt reports he has been engaging in group activities all day and want to rest. Pt reports he is tolerating medications well. Pt denies SI/HI/AVH and pain.No behavioral issues noted.  A: Support and encouragement offered as needed. Pt encouraged to attend groups and engage in milieu.  R: Patient is safe and cooperative on unit. Will continue to monitor  for safety and stability.

## 2017-09-21 NOTE — Progress Notes (Signed)
Recreation Therapy Notes  Date: 09/21/17 Time: 1000 Location: 500 Hall Dayroom  Group Topic: Decision Making, Communication  Goal Area(s) Addresses:  Patient will identify factors that guided their decision making.  Patient will identify benefit of healthy decision making post d/c.   Intervention:  Choices in a Jar Game  Activity: Choices in a Jar.  LRT read a card from the jar.  Each card gave the patients two options.  Patients would have to tell with scenario they chose and why.   Education: Pharmacist, communityocial Skills, Scientist, physiologicalDecision Making, Discharge Planning   Education Outcome: Acknowledges education  Clinical Observations/Feedback: Pt did not attend group.   Caroll RancherMarjette Oleva Koo, LRT/CTRS         Caroll RancherLindsay, Aishia Barkey A 09/21/2017 12:14 PM

## 2017-09-21 NOTE — Discharge Summary (Signed)
Physician Discharge Summary Note  Patient:  Stephen Harris is an 36 y.o., male  MRN:  [id number]  DOB:  03-12-1981  Patient phone:  [phone] (home)   Patient address:   [address] [place] [address],  Total Time spent with patient: Greater than 30 minutes  Date of Admission:  09/08/2017  Date of Discharge: 09-21-17  Reason for Admission: Worsening psychosis  Principal Problem: Schizophrenia, paranoid [facility](HCC)  Discharge Diagnoses: Patient Active Problem List   Diagnosis Date Noted  . Schizophrenia, paranoid (HCC) [F20.0] 09/09/2017  . Paranoid schizophrenia (HCC) [F20.0] 09/07/2017  . Cannabis use disorder, moderate, dependence (HCC) [F12.20] 09/07/2017   Past Psychiatric History: Paranoid Schizophrenia.  Past Medical History: History reviewed. No pertinent past medical history. History reviewed. No pertinent surgical history.  Family History: History reviewed. No pertinent family history.  Family Psychiatric  History: See H&P  Social History:  History  Alcohol Use  . Yes    Comment: It varies     History  Drug Use  . Types: Marijuana    Social History   Social History  . Marital status: Single    Spouse name: N/A  . Number of children: N/A  . Years of education: N/A   Social History Main Topics  . Smoking status: Current Some Day Smoker    Packs/day: 1.00    Types: Cigarettes  . Smokeless tobacco: Never Used  . Alcohol use Yes     Comment: It varies  . Drug use: Yes    Types: Marijuana  . Sexual activity: Not Asked   Other Topics Concern  . None   Social History Narrative  . None   Hospital Course: This is an admission assessment for this [age] year old Caucasian male. [facility] with complaints violent/aggressive behaviors, paranoid ideations & psychotic. 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 During this assessment, Stephen Harris reports, "The first & second response officers decided to take me to the hospital on Monday. There was a dispute of a man with a breech of contract who walked up to me that day completely unprepared with his schedule. In the beginning, I had gone to the store, got some liquor. The responding officers were 500-feet from me. They said that I needed to go to the hospital for psychological evaluation & to check my blood. No, I have never been hospitalized. I'm not on any medicines. I don't like medicines anyway because I cannot afford them. [place]. I have a well, but it dried up because the well broke. I have been cleaning my clothing & my body with alcohol because the mosquitoes were all over the place ".  Stephen Harris [facility] for worsening symptoms of Schizophrenia. He presented to  the hospital highly psychotic, paranoid & delusional. He presented with stories that were more or less tangential & circumstantial. He was a poor historian. He was obviously in need of mood stabilization treatments.   After evaluation of his symptoms, the medication regimen targeting those presenting symptoms were discussed & initiated. He received & was discharged on; Cogentin 1 mg for EPS, Haldol tablets 5 mg for mood control, Haldol decanoate 200 mg/472ml IM Q 30 days for mood control & Hydroxyzine 25 mg prn for anxiety. He was  oriented to the unit and encouraged to participate in the unit programming. He presented other significant medical problems (GERD) that required treatment. He received medication management for those issues. Prestyn tolerated his treatment regimen without any adverse effects or reactions reported.  During his hospital stay, Stephen Harris was evaluated daily by a clinical provider to ascertain his response to his treatment regimen. As the days go by, improvement was noted as evidenced by his  report of decreasing symptoms, improved sleep, mood, affect & participation in the unit programming.  He was required on daily basis to complete a self-inventory asssessment noting mood, mental status, new symptoms, anxiety or concerns. His symptoms responded well to his treatment regimen.   On this day of his discharge, Stephen Harris was in much improved condition than upon admission. His symptoms were reported as significantly decreased or resolved completely. Upon discharge, he denies SI/HI and voiced no AVH. He was motivated to continue taking medication with a goal of continued improvement in mental health. He was discharged to follow-up care on an outpatient basis as noted below. He was provided with all the necessary information needed to make this appointment without problems. He received a 7 days worth, supply samples of his [facility] discharge medications. Stephen Harris left [facility] in no apparent distress with all personal belongings. Transportation per his family.Marland Kitchen   Physical Findings: AIMS: Facial and Oral Movements Muscles of Facial Expression: None, normal Lips and Perioral Area: None, normal Jaw: None, normal Tongue: None, normal,Extremity Movements Upper (arms, wrists, hands, fingers): None, normal Lower (legs, knees, ankles, toes): None, normal, Trunk Movements Neck, shoulders, hips: None, normal, Overall Severity Severity of abnormal movements (highest score from questions above): None, normal Incapacitation due to abnormal movements: None, normal Patient's awareness of abnormal movements (rate only patient's report): No Awareness, Dental Status Current problems with teeth and/or dentures?: Yes Does patient usually wear dentures?: No  CIWA:  CIWA-Ar Total: 2 Stephen Harris:  Stephen Harris Total Score: 2  Musculoskeletal: Strength & Muscle Tone: within normal limits Gait & Station: normal Patient leans: N/A  Psychiatric Specialty Exam: Physical Exam  Constitutional: He appears well-developed.  HENT:  Head:  Normocephalic.  Eyes: Pupils are equal, round, and reactive to light.  Cardiovascular: Normal rate.   Respiratory: Effort normal.  GI: Soft.  Genitourinary:  Genitourinary Comments: Deferred  Musculoskeletal: Normal range of motion.  Neurological: He is alert.  Skin: Skin is warm.    Review of Systems  Constitutional: Negative.   HENT: Negative.   Eyes: Negative.   Respiratory: Negative.   Cardiovascular: Negative.   Gastrointestinal: Negative.   Genitourinary: Negative.   Musculoskeletal: Negative.   Skin: Negative.   Neurological: Negative.   Endo/Heme/Allergies: Negative.   Psychiatric/Behavioral: Positive for depression (Stabilized with medication prior to discharge), hallucinations (Hx. Psychosis) and substance abuse (Cannabis use disorder). Negative for memory loss and suicidal ideas. The patient has insomnia (Stable). The patient is not nervous/anxious.     Blood pressure 119/81, pulse 98, temperature 97.7 F (36.5 C), temperature source Oral, resp. rate 20, height 5' 8.9" (1.75 m), weight 71.7 kg (158 lb), SpO2 98 %.Body mass index is 23.4 kg/m.  See Md's SRA   Have you used any form of tobacco in the last 30 days? (Cigarettes, Smokeless Tobacco, Cigars, and/or Pipes): Yes  Has this patient used any form of tobacco in the last 30 days? (Cigarettes, Smokeless Tobacco, Cigars, and/or Pipes): N/A  Blood Alcohol level:  Lab Results  Component Value Date   ETH <10 09/06/2017   Metabolic  Disorder Labs:  Lab Results  Component Value Date   HGBA1C 5.1 09/10/2017   MPG 99.67 09/10/2017   Lab Results  Component Value Date   PROLACTIN 36.9 (H) 09/09/2017   Lab Results  Component Value Date   CHOL 153 09/10/2017   TRIG 97 09/10/2017   HDL 58 09/10/2017   CHOLHDL 2.6 09/10/2017   VLDL 19 09/10/2017   LDLCALC 76 09/10/2017   See Psychiatric Specialty Exam and Suicide Risk Assessment completed by Attending Physician prior to discharge.  Discharge destination:   Home  Is patient on multiple antipsychotic therapies at discharge:  No   Has Patient had three or more failed trials of antipsychotic monotherapy by history:  No  Recommended Plan for Multiple Antipsychotic Therapies: NA  Allergies as of 09/21/2017      Reactions   Penicillins Hives   Has patient had a PCN reaction causing immediate rash, facial/tongue/throat swelling, SOB or lightheadedness with hypotension: Yes Has patient had a PCN reaction causing severe rash involving mucus membranes or skin necrosis: No Has patient had a PCN reaction that required hospitalization: Unknown Has patient had a PCN reaction occurring within the last 10 years: No If all of the above answers are "NO", then may proceed with Cephalosporin use.      Medication List    STOP taking these medications   aspirin 325 MG tablet     TAKE these medications     Indication  benztropine 1 MG tablet Commonly known as:  COGENTIN Take 1 tablet (1 mg total) by mouth 2 (two) times daily. For prevention of drug induced tremors  Indication:  Extrapyramidal Reaction caused by Medications   haloperidol 5 MG tablet Commonly known as:  HALDOL Take 1 tablet (5 mg total) by mouth 2 (two) times daily. For mood control  Indication:  Mood control   haloperidol decanoate 100 MG/ML injection Commonly known as:  HALDOL DECANOATE Inject 2 mLs (200 mg total) into the muscle every 30 (thirty) days. (Due on 10-10-17): For mood control  Indication:  Mood control   hydrOXYzine 25 MG tablet Commonly known as:  ATARAX/VISTARIL Take 1 tablet (25 mg total) by mouth 2 (two) times daily. For anxiety  Indication:  Feeling Anxious, agitation   pantoprazole 40 MG tablet Commonly known as:  PROTONIX Take 1 tablet (40 mg total) by mouth daily. For acid reflux  Indication:  Gastroesophageal Reflux Disease   traZODone 50 MG tablet Commonly known as:  DESYREL Take 1 tablet (50 mg total) by mouth at bedtime. For sleep  Indication:   Trouble Sleeping      Follow-up Information    [facility] Follow up on 09/23/2017.   Specialty:  Behavioral Health Why:  Thursday at [facility] for your hospital follow up appointment. Bring along hospital d/c paperwork, and be prepared to stay a couple of hours. Contact information: [address] [place] [address] [phone]        [facility] Follow up.   Why:  This is a walk-in clinic that can help you get your medications until you are set up with the insurance carrier [facility]. Walk-in hours are every day, including the weekend, from 7:00am-5:30am. Please walk-in as soon as you can after arriving in [facility] information: [address] [place], [place] [address] [facility] [address] [place] and [place]) [phone]          Follow-up recommendations: Activity:  As tolerated Diet: As recommended  by your primary care doctor. Keep all scheduled follow-up appointments as recommended.   Comments: Patient is instructed prior to discharge to: Take all medications as prescribed by his/her mental healthcare provider. Report any adverse effects and or reactions from the medicines to his/her outpatient provider promptly. Patient has been instructed & cautioned: To not engage in alcohol and or illegal drug use while on prescription medicines. In the event of worsening symptoms, patient is instructed to call the crisis hotline, 911 and or go to the nearest ED for appropriate evaluation and treatment of symptoms. To follow-up with his/her primary care provider for your other medical issues, concerns and or health care needs.   Signed: Sanjuana Kava, NP, PMHNP, FNP-BC 09/21/2017, 10:19 AM   Patient seen, Suicide Assessment Completed.  Disposition Plan Reviewed  Stephen Harris is a 14 y/o M with history of schizoaffective disorder who presented initially on IVC for worsening psychotic symptoms. He had paranoid delusions  and had jumped out of a moving vehicle stating that he was going to kill a Holiday representative work working on the roadside. Pt had disorganized behaviors such as consuing his own urine and cleaning his body and clothes with alcohol and mosquito repellant. He was started on haldol oral and given haldol decanoate injection, which he tolerated without difficulty. Pt complained of some nausea and one episode of emesis during his hospitalization, but he notes today that he has no GI complaints. Today, he reports that his mood is "good." He denies SI/HI/AH/VH. He is future oriented about returning home and possibly staying with his sister from [place]. He plans to stay with his father until she arrives. He is in agreement to continue his current treatment regimen without changes. He agrees to follow up on an outpatient basis. He was able to engage in safety planning including returning to [facility] or contacting emergency services if he feels unable to maintain his own safety. Pt had no further questions, comments, or concerns. Plan Of Care/Follow-up recommendations:  - DC to outpatient level of care - Continue Haldol 5mg  po BID - Continue Haldol decanoate 200mg  IM q30Days (last injection on 09/10/17) - Continue Cogentin 1mg  BID - Continue atarax 25mg  q12h prn anxiety - Continue trazodone 50mg  qhs prn insomnia Activity:  as tolerated Diet:  normal Tests:  N/A Other:  see above for discharge treatment plan  Stephen Likens, MD

## 2017-09-21 NOTE — [facility] Suicide Risk Assessment (Signed)
[facility] Dis[facility]icide [facility]   Principal Problem: Schizophrenia, paranoid [facility](HCC) Discharge Diagnoses:  Patient Active Problem List   Diagnosis Date Noted  . Schizophrenia, paranoid (HCC) [F20.0] 09/09/2017  . Paranoid schizophrenia (HCC) [F20.0] 09/07/2017  . Cannabis use disorder, moderate, dependence (HCC) [F12.20] 09/07/2017    Total Time spent with patient: 30 minutes  Musculoskeletal: Strength & Muscle Tone: within normal limits Gait & Station: normal Patient leans: N/A  Psychiatric Specialty Exam: Review of Systems  Constitutional: Negative for chills and fever.  Respiratory: Negative for cough.   Cardiovascular: Negative for chest pain.  Gastrointestinal: Negative for abdominal pain, nausea and vomiting.  Skin: Negative for rash.  Neurological: Negative for dizziness, tremors and headaches.    Blood pressure 119/81, pulse 98, temperature 97.7 F (36.5 C), temperature source Oral, resp. rate 20, height 5' 8.9" (1.75 m), weight 71.7 kg (158 lb), SpO2 98 %.Body mass index is 23.4 kg/m.  General Appearance: Fairly Groomed  Patent attorneyye Contact::  Good  Speech:  Clear and Coherent and Normal Rate409  Volume:  Normal  Mood:  Euthymic  Affect:  Appropriate, Congruent and Constricted  Thought Process:  Coherent  Orientation:  Full (Time, Place, and Person)  Thought Content:  Logical  Suicidal Thoughts:  No  Homicidal Thoughts:  No  Memory:  Immediate;   Fair Recent;   Fair Remote;   Fair  Judgement:  Fair  Insight:  Fair  Psychomotor Activity:  Normal  Concentration:  Good  Recall:  Good  Fund of Knowledge:Good  Language: Good  Akathisia:  No  Handed:    AIMS (if indicated):     Assets:  Communication Skills Desire for Improvement Financial Resources/Insurance Resilience Social Support  Sleep:  Number of Hours: 5.5  Cognition: WNL  ADL's:  Intact   Mental Status Per Nursing Assessment::   On Admission:  NA  Demographic Factors:  Male, Caucasian and  Unemployed  Loss Factors: Financial problems/change in socioeconomic status  Historical Factors: Family history of mental illness or substance abuse  Risk Reduction Factors:   Sense of responsibility to family, Positive social support, Positive therapeutic relationship and Positive coping skills or problem solving skills  Continued Clinical Symptoms:  Schizophrenia:   Less than 38 years old  Cognitive Features That Contribute To Risk:  None    Suicide Risk:  Minimal: No identifiable suicidal ideation.  Patients presenting with no risk factors but with morbid ruminations; may be classified as minimal risk based on the severity of the depressive symptoms  Follow-up Information    [facility] Follow up on 09/23/2017.   Specialty:  Behavioral Health Why:  [facility] for your hospital follow up appointment. Bring along hospital d/c paperwork, and be prepared to stay a couple of hours. Contact information: [address] [place] [address] [phone]        [facility] Follow up.   Why:  This is a walk-in clinic that can help you get your medications until you are set up with the insurance carrier [facility]. Walk-in hours are every day, including the weekend, from 7:00am-5:30am. Please walk-in as soon as you can after [facility] Contact information: [address] [place], [place] [id number] [facility] [address] [address]) [phone]          Subjective Data: Stephen ParisJames Harris is a 36 y/o M with history of schizoaffective disorder who presented initially on IVC for worsening psychotic symptoms. He had  paranoid delusions and had jumped out of a moving vehicle stating that he was going to kill a Holiday representative work working on the roadside. Pt had disorganized behaviors such as consuing his own urine and cleaning his body and clothes with alcohol and mosquito repellant. He was started on haldol oral and  given haldol decanoate injection, which he tolerated without difficulty. Pt complained of some nausea and one episode of emesis during his hospitalization, but he notes today that he has no GI complaints. Today, he reports that his mood is "good." He denies SI/HI/AH/VH. He is future oriented about returning home and possibly staying with his sister from [place]. He plans to stay with his father until she arrives. He is in agreement to continue his current treatment regimen without changes. He agrees to follow up on an outpatient basis. He was able to engage in safety planning including returning to [facility] or contacting emergency services if he feels unable to maintain his own safety. Pt had no further questions, comments, or concerns.   Plan Of Care/Follow-up recommendations:  - DC to outpatient level of care - Continue Haldol 5mg  po BID - Continue Haldol decanoate 200mg  IM q30Days (last injection on 09/10/17) - Continue Cogentin 1mg  BID - Continue atarax 25mg  q12h prn anxiety - Continue trazodone 50mg  qhs prn insomnia Activity:  as tolerated Diet:  normal Tests:  N/A Other:  see above for discharge treatment plan  Micheal Likens, MD 09/21/2017, 10:39 AM

## 2017-09-21 NOTE — Progress Notes (Signed)
Pt d/c from the hospital with his father. All items returned. D/C instructions given, prescriptions given and samples given. Pt denies si and hi.

## 2017-09-21 NOTE — Plan of Care (Signed)
Problem: [facility] Participation in Recreation Therapeutic Interventions Goal: STG-Patient will attend/participate in Rec Therapy Group Ses STG-The Patient will attend and participate in Recreation Therapy Group Sessions  Outcome: Completed/Met Date Met: 09/21/17 Pt attended and participated in coping skills, leisure education, wellness, communication and goals recreation therapy sessions.   Victorino Sparrow, LRT/CTRS
